# Patient Record
Sex: Male | Born: 1940 | Race: White | Hispanic: No | Smoking: Former smoker
Health system: Southern US, Community
[De-identification: ages and names within clinical notes are randomized; demographics above are authoritative.]

## PROBLEM LIST (undated history)

## (undated) DIAGNOSIS — K219 Gastro-esophageal reflux disease without esophagitis: Secondary | ICD-10-CM

## (undated) DIAGNOSIS — I10 Essential (primary) hypertension: Secondary | ICD-10-CM

## (undated) DIAGNOSIS — E7800 Pure hypercholesterolemia, unspecified: Secondary | ICD-10-CM

## (undated) DIAGNOSIS — K229 Disease of esophagus, unspecified: Secondary | ICD-10-CM

## (undated) DIAGNOSIS — I2510 Atherosclerotic heart disease of native coronary artery without angina pectoris: Secondary | ICD-10-CM

## (undated) DIAGNOSIS — I214 Non-ST elevation (NSTEMI) myocardial infarction: Secondary | ICD-10-CM

## (undated) HISTORY — PX: CORONARY ANGIOPLASTY: SHX604

## (undated) HISTORY — DX: Atherosclerotic heart disease of native coronary artery without angina pectoris: I25.10

## (undated) HISTORY — PX: CORONARY STENT PLACEMENT: SHX1402

## (undated) HISTORY — DX: Essential (primary) hypertension: I10

---

## 2001-04-06 ENCOUNTER — Ambulatory Visit (HOSPITAL_COMMUNITY): Admission: RE | Admit: 2001-04-06 | Discharge: 2001-04-06 | Payer: Self-pay | Admitting: *Deleted

## 2001-04-06 ENCOUNTER — Encounter: Payer: Self-pay | Admitting: Cardiology

## 2001-07-19 ENCOUNTER — Emergency Department (HOSPITAL_COMMUNITY): Admission: EM | Admit: 2001-07-19 | Discharge: 2001-07-19 | Payer: Self-pay | Admitting: Emergency Medicine

## 2004-11-25 DIAGNOSIS — I2510 Atherosclerotic heart disease of native coronary artery without angina pectoris: Secondary | ICD-10-CM

## 2004-11-25 HISTORY — DX: Atherosclerotic heart disease of native coronary artery without angina pectoris: I25.10

## 2005-05-05 ENCOUNTER — Observation Stay (HOSPITAL_COMMUNITY): Admission: EM | Admit: 2005-05-05 | Discharge: 2005-05-08 | Payer: Self-pay | Admitting: Emergency Medicine

## 2008-11-11 ENCOUNTER — Ambulatory Visit (HOSPITAL_BASED_OUTPATIENT_CLINIC_OR_DEPARTMENT_OTHER): Admission: RE | Admit: 2008-11-11 | Discharge: 2008-11-11 | Payer: Self-pay | Admitting: Orthopedic Surgery

## 2008-11-15 ENCOUNTER — Encounter: Payer: Self-pay | Admitting: Infectious Diseases

## 2008-11-29 ENCOUNTER — Ambulatory Visit: Payer: Self-pay | Admitting: Infectious Diseases

## 2008-11-29 DIAGNOSIS — I2510 Atherosclerotic heart disease of native coronary artery without angina pectoris: Secondary | ICD-10-CM | POA: Insufficient documentation

## 2008-11-29 HISTORY — DX: Atherosclerotic heart disease of native coronary artery without angina pectoris: I25.10

## 2008-11-30 ENCOUNTER — Encounter: Payer: Self-pay | Admitting: Infectious Diseases

## 2008-11-30 LAB — CONVERTED CEMR LAB
ALT: 44 units/L (ref 0–53)
AST: 30 units/L (ref 0–37)
Albumin: 4.3 g/dL (ref 3.5–5.2)
Alkaline Phosphatase: 42 units/L (ref 39–117)
BUN: 14 mg/dL (ref 6–23)
Basophils Absolute: 0 10*3/uL (ref 0.0–0.1)
Basophils Relative: 1 % (ref 0–1)
CO2: 23 meq/L (ref 19–32)
Calcium: 9.1 mg/dL (ref 8.4–10.5)
Chloride: 108 meq/L (ref 96–112)
Creatinine, Ser: 0.98 mg/dL (ref 0.40–1.50)
Eosinophils Absolute: 0.3 10*3/uL (ref 0.0–0.7)
Eosinophils Relative: 5 % (ref 0–5)
Glucose, Bld: 91 mg/dL (ref 70–99)
HCT: 43.6 % (ref 39.0–52.0)
Hemoglobin: 15.7 g/dL (ref 13.0–17.0)
Lymphocytes Relative: 33 % (ref 12–46)
Lymphs Abs: 2 10*3/uL (ref 0.7–4.0)
MCHC: 36 g/dL (ref 30.0–36.0)
MCV: 93.4 fL (ref 78.0–100.0)
Monocytes Absolute: 0.5 10*3/uL (ref 0.1–1.0)
Monocytes Relative: 9 % (ref 3–12)
Neutro Abs: 3.2 10*3/uL (ref 1.7–7.7)
Neutrophils Relative %: 52 % (ref 43–77)
Platelets: 196 10*3/uL (ref 150–400)
Potassium: 4.2 meq/L (ref 3.5–5.3)
RBC: 4.67 M/uL (ref 4.22–5.81)
RDW: 13.3 % (ref 11.5–15.5)
Sodium: 141 meq/L (ref 135–145)
Total Bilirubin: 0.4 mg/dL (ref 0.3–1.2)
Total Protein: 7.1 g/dL (ref 6.0–8.3)
WBC: 6.1 10*3/uL (ref 4.0–10.5)

## 2008-12-07 ENCOUNTER — Telehealth: Payer: Self-pay | Admitting: Infectious Diseases

## 2008-12-07 ENCOUNTER — Ambulatory Visit: Payer: Self-pay | Admitting: Infectious Diseases

## 2008-12-07 DIAGNOSIS — A319 Mycobacterial infection, unspecified: Secondary | ICD-10-CM | POA: Insufficient documentation

## 2008-12-14 ENCOUNTER — Encounter: Payer: Self-pay | Admitting: Infectious Diseases

## 2008-12-21 ENCOUNTER — Telehealth: Payer: Self-pay | Admitting: Infectious Diseases

## 2008-12-29 ENCOUNTER — Ambulatory Visit: Payer: Self-pay | Admitting: Infectious Diseases

## 2009-02-01 ENCOUNTER — Encounter: Payer: Self-pay | Admitting: Infectious Diseases

## 2011-01-01 ENCOUNTER — Ambulatory Visit
Admission: RE | Admit: 2011-01-01 | Discharge: 2011-01-01 | Disposition: A | Discharge status: Home / Self Care | Payer: MEDICARE | Source: Ambulatory Visit | Attending: Orthopedic Surgery | Admitting: Orthopedic Surgery

## 2011-01-01 ENCOUNTER — Other Ambulatory Visit: Payer: Self-pay | Admitting: Orthopedic Surgery

## 2011-01-01 DIAGNOSIS — M542 Cervicalgia: Secondary | ICD-10-CM

## 2011-01-03 ENCOUNTER — Other Ambulatory Visit: Payer: Self-pay | Admitting: Orthopedic Surgery

## 2011-01-03 DIAGNOSIS — M542 Cervicalgia: Secondary | ICD-10-CM

## 2011-01-08 ENCOUNTER — Other Ambulatory Visit: Payer: MEDICARE

## 2011-01-08 ENCOUNTER — Ambulatory Visit
Admission: RE | Admit: 2011-01-08 | Discharge: 2011-01-08 | Disposition: A | Discharge status: Home / Self Care | Payer: MEDICARE | Source: Ambulatory Visit | Attending: Orthopedic Surgery | Admitting: Orthopedic Surgery

## 2011-01-08 DIAGNOSIS — M542 Cervicalgia: Secondary | ICD-10-CM

## 2011-04-09 NOTE — Op Note (Signed)
Nicholas Reilly, Nicholas Reilly             ACCOUNT NO.:  [id number]   MEDICAL RECORD NO.:  [id number]          PATIENT TYPE:  AMB   LOCATION:  DSC                          FACILITY:  [facility]   PHYSICIAN:  Cindee Salt, M.D.       DATE OF BIRTH:  [birth year]   DATE OF PROCEDURE:  11/11/2008  DATE OF DISCHARGE:                               OPERATIVE REPORT   PREOPERATIVE DIAGNOSIS:  Infection right hand with nodularity along  lymphatics including epitrochlear node.   POSTOPERATIVE DIAGNOSIS:  Infection right hand with nodularity along  lymphatics including epitrochlear node.   OPERATION:  Incision and drainage of wound on hand, biopsy of  epitrochlear node with cultures.   SURGEON:  Cindee Salt, MD   ASSISTANT:  Carolyne Fiscal R.N.   ANESTHESIA:  General.   ANESTHESIOLOGIST:  Zenon Mayo, MD.   HISTORY:  The patient is a 70 year old male who suffered a puncture  wound to the inner metacarpal space of his right hand.  He has had  progressive swelling.  He has developed nodularity going up his arm with  the possibility of this being sporotrichosis.  He has not responded to  antibiotics for bacterial infection.  He is admitted now for incision  and drainage, cultures and biopsy of the epitrochlear node for  diagnosis.  He is aware of risks and complications including infection,  recurrence injury to arteries, nerves, tendons incomplete relief, and  the necessity of an Infectious Disease consultation especially if this  is fungal in nature.  The patient is seen in the preoperative area.  The  extremity marked by both the patient and the surgeon.   PROCEDURE:  The patient is brought to the operating room where general  anesthetic was carried out without difficulty under the direction of Dr.  Sampson Goon.  He was prepped using DuraPrep, supine position, right arm  free.  A time-out was taken to confirm the patient and procedure.  The  arm was elevated for exsanguination and tourniquet placed on  the upper  arm was inflated to 250 mmHg.  An incision was made over the area of the  inner metacarpal space over the metacarpal head of the index finger.  Purulent material was immediately encountered.  Cultures were taken for  both aerobic and anaerobic cultures.  Several large pearl type lesions  were noted.  These were excised along with a portion of the skin and  also sent to pathology for inspection.  A separate incision was then  made using a new knife in the more proximal nodule.  This was carried  down through the subcutaneous tissue.  Again, purulence was noted with  the similar pearl type findings.  These were also sent to pathology  along with cultures.  The epitrochlear node was then attended to.  Next,  a new blade again used and incision made.  This was carried down through  subcutaneous tissue.  The nodule was identified along the cephalic vein.  This was gently dissected free and opened.  Purulent material was again  encountered.  Cultures were taken at this level.  The node was  removed  and also sent to pathology.  On opening, it also had the pearly type  changes within the lymph node.  The wounds were then irrigated.  The  epitrochlear area was closed with interrupted 4-0 Vicryl Rapide sutures  in the skin only.  The more proximal wound was closed with interrupted 4-  0 Vicryl.  The initial area of the incision and injury was packed  with iodoform gauze.  Sterile compressive dressing was applied to each  of the wounds.  The patient tolerated the procedure well and was taken  to the recovery room for observation in satisfactory condition.  He will  be discharged home to return to the [facility] in 1 week  on Talwin NX and Septra DS.           ______________________________  Cindee Salt, M.D.     GK/MEDQ  D:  11/11/2008  T:  11/12/2008  Job:  [id number]   cc:   Dr. Arlyce Dice

## 2011-04-12 NOTE — Cardiovascular Report (Signed)
[name], Reilly             ACCOUNT NO.:  [id number]   MEDICAL RECORD NO.:  [id number]          PATIENT TYPE:  INP   LOCATION:  2025                         FACILITY:  [facility]   PHYSICIAN:  Nanetta Batty, M.D.   DATE OF BIRTH:  1941/05/23   DATE OF PROCEDURE:  05/07/2005  DATE OF DISCHARGE:                              CARDIAC CATHETERIZATION   CLINICAL HISTORY:  Nicholas Reilly is a 70 year old gentleman admitted May 05, 2005 with unstable angina.  His other risk factors include borderline  diabetes, hypertension, hyperlipidemia.  He has had two to four weeks of  accelerated chest pain and fatigue.  He was ruled out for myocardial  infarction and electrocardiographic changes.  Presents now for diagnostic  coronary arteriography.   PROCEDURE DESCRIPTION:  Patient was brought to the second floor [facility] in the postabsorptive state.  He was  premedicated with p.o. Valium.  His right groin was prepped and shaved in  the usual sterile fashion.  1% Xylocaine was used for local anesthesia.  A 6-  French sheath was inserted into the right femoral artery using standard  Seldinger technique.  6-French right and left Judkins diagnostic catheters  along with 6-French pigtail catheter were used for selective coronary  angiography, left ventriculography, subselective left internal mammary  artery angiography, and distal abdominal aortography.  Visipaque dye was  used for the entirety of the case.  Retrograde aortic, left ventricular, and  pullback pressures were recorded.   HEMODYNAMICS:  1.  Aortic systolic pressure 124, end-diastolic pressure 75.  2.  Left ventricular systolic pressure 129, end-diastolic pressure 15.   SELECTIVE CORONARY ANGIOGRAPHY:  Left main normal.   LAD:  The LAD had a 30% ostial stenosis.  There was a 40% segmental mid  stenosis after the third small diagonal branch.   Ramus intermedius:  __________ this is a high diagonal  branch, was moderate  in size and distally bifurcating.  It was free of significant disease.   Left circumflex:  This is a fairly large vessel, but nondominant.  There is  a large segment of moderate stenosis in the 70% range in the mid portion AV  groove circumflex.   Right coronary artery:  This is a moderate sized dominant vessel with a 70%  segmental mid stenosis.   LEFT VENTRICULOGRAM:  RAO left ventriculogram was performed using 25 mL of  Visipaque dye at 12 mL/second.  The overall LV EF was estimated at greater  than 60% without focal wall motion abnormalities.   Left internal mammary artery:  Subselectively visualized and was widely  patent.  It was suitable for use during coronary artery bypass grafting.   DISTAL ABDOMINAL AORTOGRAPHY:  Distal abdominal aortogram was performed  using 20 mL of Visipaque dye at 20 mL/second.  The renal arteries were  widely patent.  The infrarenal abdominal aorta and iliac bifurcation appear  free of significant atherosclerotic changes.   IMPRESSION:  Nicholas Reilly has moderate mid circumflex and right coronary  artery disease.  We will plan on performing IVUS guided PCI and stenting.   The existing 6-French in  the right femoral artery was exchanged over a wire  for a 7-French sheath.  A 6-French sheath was then inserted through a  femoral vein.  Patient received 5000 units of heparin total with an ACT of  258 at the end of the case.  He had received aspirin this morning and  received 600 mg p.o. Plavix.  Also received Integrilin double bolus  infusion.  Visipaque dye was used for the entirety of the intervention.  Retrograde aortic pressure was monitored during the case.  Patient received  200 mcg of intracoronary nitroglycerin down the circumflex coronary artery  after intervention.   Using a 7-French JL3.5 guiding catheter along with an 0.014 190 long Asahi  soft wire and a 2.5 x 15 Maverick PCI was performed after performance of   intravascular ultrasound.  The distal reference for the circumflex was 3.2 x  3.4 with a lesion diameter of 1.3 x 1.5.  The Maverick balloon was dilated  in an overlapping fashion at nominal pressures.  Following this a 3 x 28  CYPHER drug-eluting stent was then deployed at 14 atmospheres resulting in  reduction of 70% segmental mid circumflex stenosis to 0% residual.   Following this using a 7-French hockey stick with side holes guide catheter  along with an 0.014 190 Asahi wire and an IVUS catheter intravascular  ultrasound was performed revealing a distal reference of 3.1 x 3.2 and a  lesion diameter of 1.8 x 1.9.  This was then dilated with a 2.5 x 15  Maverick and stented with a 3 x 23 CYPHER at 14 atmospheres resulting in  reduction of a 70% segmental mid RCA stenosis to 0% residual.  Patient  tolerated the procedure well.  There were no hemodynamic or  electrocardiographic sequelae.  The guidewire and catheters were removed and  the sheaths were sewn securely in place.  Patient left the laboratory in  stable condition.   OVERALL IMPRESSION:  Successful IVUS guided PCI and stenting of the mid  circumflex and right coronary artery using CYPHER drug-eluting stents and  Integrilin __________ 2B3A inhibition.  The guidewire and catheters were  removed and the sheaths were sewn securely in place.  Patient left the  laboratory in stable condition.  Plans will be to hydrate him overnight.  The sheaths will be removed once the ACT falls below 150.  Treat with  aspirin and Plavix.  Discharge home in the morning.  Will follow up in  approximately two weeks.  Dr. Arlyce Dice was notified of these results.       JB/MEDQ  D:  05/07/2005  T:  05/07/2005  Job:  [id number]   cc:   Cath Lab   [facility]   Teena Irani. Arlyce Dice, M.D.  [address]  [place]  [place] [address]  Fax: [phone]

## 2011-04-12 NOTE — Discharge Summary (Signed)
Nicholas Reilly, REPSHER             ACCOUNT NO.:  [id number]   MEDICAL RECORD NO.:  [id number]          PATIENT TYPE:  INP   LOCATION:  6531                         FACILITY:  [facility]   PHYSICIAN:  Madaline Savage, M.D.DATE OF BIRTH:  11-Sep-1941   DATE OF ADMISSION:  05/05/2005  DATE OF DISCHARGE:  05/08/2005                                 DISCHARGE SUMMARY   DISCHARGE DIAGNOSES:  1.  Unstable angina with negative myocardial infarction.  2.  History of borderline hypertension.  3.  History of elevated glucose.  4.  History of depression and anxiety.  5.  Coronary artery disease, undergoing percutaneous transluminal coronary      angioplasty and stenting to the right coronary artery and the mid-      circumflex using CYPHER drug-eluting stent.  6.  Dyslipidemia.   DISCHARGE MEDICATIONS:  1.  Protonix 40 mg daily.  2.  Plavix 75 mg daily for at least 6 months.  3.  Lipitor 40 q.p.m.  4.  Lopressor 50 mg take half a tablet b.i.d.  5.  Enteric coated aspirin 81 mg daily.  6.  Prozac 40 mg daily.  7.  Nitroglycerin sublingual p.r.n. chest pain.   DISCHARGE INSTRUCTIONS:  1.  No strenuous activity.  2.  No lifting over 10 pounds for 3 days, then resuming regular activity.  3.  Low fat, low salt diet.  4.  Wash right groin cath site with soap and water; call if any bleeding,      swelling or drainage.  5.  Follow up with Dr. Allyson Sabal, May 22, 2005, at 3:15 p.m.   HISTORY OF PRESENT ILLNESS:  A 70 year old white married male patient came  to the emergency room on May 05, 2005 secondary to chest pain.  [facility] was asked to evaluate as patient had an  appointment to see Dr. Allyson Sabal on May 06, 2005.  Patient had been having  chest pain for weeks with shortness of breath, numbness in his left hand and  arm and one night of hot sweats.  One episode lasted about an hour and had  chest pain.  No nausea, no vomiting.  Over the last 3 days prior to this  admission,  it was progressively worse with episodes lasting longer and  longer with greater intensity, also decreased energy.  Came to the emergency  room and was seen and evaluated.   Past medical history includes borderline hypertension and elevated glucose,  unknown cholesterol.  No tobacco use.  He did smoke for 25 years and quit  around 20 years ago.  No surgery.  History of depression and anxiety.   OUTPATIENT MEDICATIONS:  1.  Prozac 40 mg daily.  2.  Enteric coated aspirin daily.   ALLERGIES:  BENADRYL causes nervousness and patient had a reaction to NUBAIN  during the hospitalization.   Family history, social history, review of systems -- see H&P.   PHYSICAL EXAMINATION AT DISCHARGE:  VITAL SIGNS:  Blood pressure 112/58,  pulse 64, respirations 20, temp 98.3.  Room air oxygen saturation 95%.  GENERAL:  On exam, alert, oriented, without  complaints.  CHEST:  No chest pain.  CARDIAC:  Heart sounds S1, S2, regular rate and rhythm.  LUNGS:  Clear.  EXTREMITIES:  Right groin is stable.   PROCEDURES:  May 07, 2005:  Combined left heart cath with stenosis of the  RCA at 70% and the mid-circ at 70%, underwent PTCA and stenting.  He also  has 30-40% LAD stenosis, normal renals, also normal LV function.   LABORATORY DATA:  Hemoglobin on admission 15.6, hematocrit 44, WBC 6.3,  platelets 200.  Neutrophils 56, lymphocytes 29, monocytes 8, eosinophils 6,  basophils 1, ___________ stable.  Pro time 1.9, INR 0.9, PTT 30.  Sodium  141, potassium 4.5, chloride 109, CO2 25, glucose 126, BUN 9, creatinine  1.1, calcium 8.8, total protein 6.1, albumin 3.8, AST 41, ALT 40, total  bilirubin 1.1, magnesium 2.2, and those remained stable.  Glycohemoglobin  was 5.3.  Cardiac enzymes: CK 274, MB 5.5, CK 225, MB 4.4, CK is 191 and  114, MB is 3.9 and 2.2.  Troponin I less than 0.01.  Total cholesterol 187,  triglycerides 376, HDL 29, and LDL 83.  UA was clear.   Chest x-ray revealed no evidence for acute  abnormality.  EKG: Sinus rhythm,  normal EKG.   HOSPITAL COURSE:  Mr. Kirschenmann was admitted by Dr. Elsie Lincoln on call for Dr.  Allyson Sabal secondary to increasing chest pain.  He was admitted and placed on IV  heparin and p.r.n. nitroglycerin and plans for cardiac cath were made.  He  underwent cardiac cath on May 07, 2005.  Enzymes had all been negative and  procedure as previously stated.  By May 08, 2005 he was stable without  complaints ready for discharge home.  Note, had followup with Dr. Allyson Sabal.      Darcella Gasman. Annie Paras, N.P.    ______________________________  Madaline Savage, M.D.    LRI/MEDQ  D:  07/18/2005  T:  07/19/2005  Job:  [id number]   cc:   Madaline Savage, M.D.  [address]  [place]  [place] [address]  Fax: [phone]   Nanetta Batty, M.D.  Fax: [phone]   Teena Irani. Arlyce Dice, M.D.  [address]  [place]  [place] [address]  Fax: [phone]

## 2011-08-30 LAB — TISSUE CULTURE: Culture: NO GROWTH

## 2011-08-30 LAB — AFB CULTURE WITH SMEAR (NOT AT ARMC)
Acid Fast Smear: NONE SEEN
Acid Fast Smear: NONE SEEN

## 2011-08-30 LAB — WOUND CULTURE
Culture: NO GROWTH
Culture: NO GROWTH

## 2011-08-30 LAB — FUNGUS CULTURE W SMEAR
Fungal Smear: NONE SEEN
Fungal Smear: NONE SEEN

## 2011-08-30 LAB — ANAEROBIC CULTURE

## 2011-08-30 LAB — POCT HEMOGLOBIN-HEMACUE: Hemoglobin: 15.5 g/dL (ref 13.0–17.0)

## 2013-05-09 ENCOUNTER — Emergency Department (HOSPITAL_COMMUNITY)
Admission: EM | Admit: 2013-05-09 | Discharge: 2013-05-09 | Disposition: A | Discharge status: Home / Self Care | Payer: Medicare Other | Attending: Emergency Medicine | Admitting: Emergency Medicine

## 2013-05-09 ENCOUNTER — Encounter (HOSPITAL_COMMUNITY): Payer: Self-pay | Admitting: Emergency Medicine

## 2013-05-09 ENCOUNTER — Encounter (HOSPITAL_COMMUNITY)
Admission: EM | Disposition: A | Discharge status: Home / Self Care | Payer: Self-pay | Source: Home / Self Care | Attending: Emergency Medicine

## 2013-05-09 ENCOUNTER — Emergency Department (HOSPITAL_COMMUNITY): Payer: Medicare Other

## 2013-05-09 DIAGNOSIS — T18128A Food in esophagus causing other injury, initial encounter: Secondary | ICD-10-CM

## 2013-05-09 DIAGNOSIS — Z800 Family history of malignant neoplasm of digestive organs: Secondary | ICD-10-CM | POA: Insufficient documentation

## 2013-05-09 DIAGNOSIS — R1310 Dysphagia, unspecified: Secondary | ICD-10-CM | POA: Insufficient documentation

## 2013-05-09 DIAGNOSIS — K219 Gastro-esophageal reflux disease without esophagitis: Secondary | ICD-10-CM | POA: Insufficient documentation

## 2013-05-09 DIAGNOSIS — K222 Esophageal obstruction: Secondary | ICD-10-CM | POA: Insufficient documentation

## 2013-05-09 DIAGNOSIS — E7800 Pure hypercholesterolemia, unspecified: Secondary | ICD-10-CM | POA: Insufficient documentation

## 2013-05-09 DIAGNOSIS — K449 Diaphragmatic hernia without obstruction or gangrene: Secondary | ICD-10-CM | POA: Insufficient documentation

## 2013-05-09 HISTORY — DX: Gastro-esophageal reflux disease without esophagitis: K21.9

## 2013-05-09 HISTORY — DX: Atherosclerotic heart disease of native coronary artery without angina pectoris: I25.10

## 2013-05-09 HISTORY — DX: Pure hypercholesterolemia, unspecified: E78.00

## 2013-05-09 HISTORY — DX: Disease of esophagus, unspecified: K22.9

## 2013-05-09 HISTORY — PX: ESOPHAGOGASTRODUODENOSCOPY (EGD) WITH ESOPHAGEAL DILATION: SHX5812

## 2013-05-09 SURGERY — ESOPHAGOGASTRODUODENOSCOPY (EGD) WITH ESOPHAGEAL DILATION
Anesthesia: Moderate Sedation

## 2013-05-09 SURGERY — EGD (ESOPHAGOGASTRODUODENOSCOPY)
Anesthesia: Moderate Sedation

## 2013-05-09 MED ORDER — SODIUM CHLORIDE 0.9 % IV SOLN: INTRAVENOUS | Status: DC

## 2013-05-09 MED ORDER — ONDANSETRON HCL 4 MG/2ML IJ SOLN
4.0000 mg | Freq: Once | INTRAMUSCULAR | Status: AC
  Administered 2013-05-09: 4 mg via INTRAVENOUS
  Filled 2013-05-09: qty 2

## 2013-05-09 MED ORDER — FENTANYL CITRATE 0.05 MG/ML IJ SOLN
INTRAMUSCULAR | Status: DC | PRN
  Administered 2013-05-09 (×2): 25 ug via INTRAVENOUS

## 2013-05-09 MED ORDER — GLUCAGON HCL (RDNA) 1 MG IJ SOLR
1.0000 mg | Freq: Once | INTRAMUSCULAR | Status: AC
  Administered 2013-05-09: 1 mg via INTRAVENOUS
  Filled 2013-05-09: qty 1

## 2013-05-09 MED ORDER — MIDAZOLAM HCL 10 MG/2ML IJ SOLN
INTRAMUSCULAR | Status: DC | PRN
  Administered 2013-05-09: 1 mg via INTRAVENOUS
  Administered 2013-05-09 (×2): 2 mg via INTRAVENOUS

## 2013-05-09 MED ORDER — SODIUM CHLORIDE 0.9 % IV BOLUS (SEPSIS)
500.0000 mL | Freq: Once | INTRAVENOUS | Status: AC
  Administered 2013-05-09: 18:00:00 via INTRAVENOUS

## 2013-05-09 MED ORDER — BUTAMBEN-TETRACAINE-BENZOCAINE 2-2-14 % EX AERO
INHALATION_SPRAY | CUTANEOUS | Status: DC | PRN
  Administered 2013-05-09: 2 via TOPICAL

## 2013-05-09 NOTE — ED Notes (Addendum)
Pt coming from [facility] [id number]). Pt hx of cardiac stents. Reports since last night he vomits every time he eats or drinks. Feels like something is stuck in his throat, feels this in the middle of his chest. [facility] not [facility].   PA Havier saw pt

## 2013-05-09 NOTE — Consult Note (Signed)
Reason for Consult: Dysphagia and possible food impaction. Referring Physician: ER  Nicholas Reilly HPI: This is a 72 year old male with a PMH of dysphagia and a family history of colon cancer who presented to the ER with a food impaction.  He was eating steak last evening at 8 PM and acutely he was not able to tolerate any of his oral secretions.  In the past he reports numerous episodes of dysphagia that resolves within 45 minutes with the use of benadryl and Nexium, but his symptoms continued to persist.  It worsened to the point that he was not able to handle his oral secretions.  Three years ago he underwent an EGD/Colonoscopy with Dr. Loreta Ave, but he cannot recall if any dilation was performed.  The colonoscopy was negative for any abnormalities.  The patient has a history of CAD s/p stent placement 9 years ago and he was taking Plavix, however, he denies taking the medication for the past week.  He states that he had some type of virus and he held the medication.  Past Medical History  Diagnosis Date  . Esophageal abnormality   . GERD (gastroesophageal reflux disease)   . Hypercholesteremia     Past Surgical History  Procedure Laterality Date  . Coronary stent placement      History reviewed. No pertinent family history.  Social History:  reports that he has never smoked. He does not have any smokeless tobacco history on file. He reports that he does not drink alcohol or use illicit drugs.  Allergies:  Allergies  Allergen Reactions  . Doxycycline     REACTION: nausea, vommiting    Medications:  Scheduled:  Continuous: . sodium chloride      No results found for this or any previous visit (from the past 24 hour(s)).   Dg Abd Acute W/chest  05/09/2013   *RADIOLOGY REPORT*  Clinical Data: Swallowed foreign body  ACUTE ABDOMEN SERIES (ABDOMEN 2 VIEW & CHEST 1 VIEW)  Comparison: Chest radiograph 05/05/2005  Findings: Heart, mediastinal, hilar contours are normal.  Cardiac leads  project over the chest.  The lungs are clear.  Negative for pleural effusion or pneumothorax.  No unexpected radiopaque foreign body is identified in the chest.  No free intraperitoneal air.  Bowel gas pattern is nonobstructive. No unexpected radiopaque foreign body projects over the abdomen or pelvis. Probable atherosclerotic calcifications in the expected location of the splenic artery.  Phleboliths are seen in the pelvis.  No definite urinary tract stone.  Typical degenerative changes of the spine.  IMPRESSION:  1.  No unexpected radiopaque foreign body is identified in the chest abdomen or pelvis. 2.  Nonobstructive bowel gas pattern. 3.  No acute cardiopulmonary disease.   Original Report Authenticated By: Britta Mccreedy, M.D.    ROS:  As stated above in the HPI otherwise negative.  Blood pressure 134/61, pulse 80, temperature 98.6 F (37 C), temperature source Oral, resp. rate 18, height 6' (1.829 m), weight 226 lb (102.513 kg), SpO2 96.00%.    PE: Gen: NAD, Alert and Oriented HEENT:  [place]/AT, EOMI Neck: Supple, no LAD Lungs: CTA Bilaterally CV: RRR without M/G/R ABM: Soft, NTND, +BS Ext: No C/C/E  Assessment/Plan: 1) Possible food impaction.  Plan: 1) Emergent EGD.  Giang Hemme D 05/09/2013, 9:15 PM

## 2013-05-09 NOTE — ED Notes (Signed)
Patient is not in any apparent distress. States that he feels something is stuck in his throat.

## 2013-05-09 NOTE — Op Note (Signed)
[facility] [address] [place], [address]   OPERATIVE PROCEDURE REPORT  PATIENT: Nicholas Reilly, Nicholas Reilly  MR#: [id number] BIRTHDATE: 1941-09-12  GENDER: Male ENDOSCOPIST: Jeani Hawking, MD ASSISTANT:   Beryle Beams, technician and Anthony Sar, RN PROCEDURE DATE: 05/09/2013 PROCEDURE:   EGD with dilation and biopsies ASA CLASS:   Class III INDICATIONS:Dysphagia. MEDICATIONS: Fentanyl 50 mcg IV and Versed 5 mg IV TOPICAL ANESTHETIC:   Cetacaine Spray DESCRIPTION OF PROCEDURE:   After the risks benefits and alternatives of the procedure were thoroughly explained, informed consent was obtained.  The PENTAX GASTOROSCOPE [id number]  endoscope was introduced through the mouth  and advanced to the second portion of the duodenum Without limitations.      The instrument was slowly withdrawn as the mucosa was fully examined.   FINDINGS: Upon initial entry into the esophagus there was evidence of concentric rings suggesting EoE.  In the distal esophagus there was a meat bolus that was able to be pushed into the gastric lumen. A benign peptic appearing stricture was noted in the setting of a 5 cm hiatal hernia.  No other abnormalities were noted in the gastric lumen and the duodenum.  Over a guidewire a 12.8 mm Savary dilator was inserted without any resistance.  Subsequently the 14, 15, and 16 mm dilators were used and after the 16 mm dilation blood was noted on the dilator.  No evidence of crepitus after the dilation.  A relook endoscopy was performed and the expected mucosal tear was identified.  Biopsies of the mid and proximal esophagus were performed to evaluate for EoE.          The scope was then withdrawn from the patient and the procedure terminated.  COMPLICATIONS: There were no complications. IMPRESSION: 1) Benign distal esophageal stricture s/p dilatino. 2) 5 cm hiatal hernia. 3) ? Eosinophilic esophagitis.  RECOMMENDATIONS: 1) Chew food well. 2) Continue  with Nexium. 3) Restart Plavix in 1-2 days. 4) Await biopsy results and follow up with Dr. Loreta Ave in 2-4 weeks.  _______________________________ Rosalie DoctorJeani Hawking, MD 05/09/2013 10:06 PM

## 2013-05-09 NOTE — ED Provider Notes (Signed)
History     CSN: [id number]  Arrival date & time 05/09/13  1728   First MD Initiated Contact with Patient 05/09/13 1806      Chief Complaint  Patient presents with  . Swallowed Foreign Body    (Consider location/radiation/quality/duration/timing/severity/associated sxs/prior treatment) Patient is a 72 y.o. male presenting with foreign body swallowed. The history is provided by the patient.  Swallowed Foreign Body This is a recurrent problem. Associated symptoms include chest pain and abdominal pain. Pertinent negatives include no headaches and no shortness of breath.   patient states he has had repeat issues of food getting stuck in his esophagus. He states is due to food allergies and normally he takes Benadryl and Zyrtec and will go away. He states around 8:00 last night she was eating and felt something is stuck at the bottom of his chest. He states since then has had vomiting and has had stomach contents coming up. He states he has been having to spit into a cup. He states he has a little tightness in his chest. No fevers. No cough. No lightheadedness. Patient states he has had decreased gas also. She does not get any larger.Patient states that he has had an upper GI and colonoscopy previously. He states was done off of [facility].  Past Medical History  Diagnosis Date  . Esophageal abnormality   . GERD (gastroesophageal reflux disease)   . Hypercholesteremia   . Coronary artery disease     Past Surgical History  Procedure Laterality Date  . Coronary stent placement    . Coronary angioplasty      History reviewed. No pertinent family history.  History  Substance Use Topics  . Smoking status: Never Smoker   . Smokeless tobacco: Not on file  . Alcohol Use: No      Review of Systems  Constitutional: Negative for activity change and appetite change.  HENT: Negative for neck stiffness.   Eyes: Negative for pain.  Respiratory: Negative for chest tightness and  shortness of breath.   Cardiovascular: Positive for chest pain. Negative for leg swelling.  Gastrointestinal: Positive for vomiting and abdominal pain. Negative for nausea and diarrhea.  Genitourinary: Negative for flank pain.  Musculoskeletal: Negative for back pain.  Skin: Negative for rash.  Neurological: Negative for weakness, numbness and headaches.  Psychiatric/Behavioral: Negative for behavioral problems.    Allergies  Doxycycline  Home Medications   Current Outpatient Rx  Name  Route  Sig  Dispense  Refill  . clopidogrel (PLAVIX) 75 MG tablet   Oral   Take 75 mg by mouth daily.         . diphenhydrAMINE (SOMINEX) 25 MG tablet   Oral   Take 25 mg by mouth at bedtime as needed for sleep.         Marland Kitchen esomeprazole (NEXIUM) 40 MG capsule   Oral   Take 40 mg by mouth daily before breakfast.         . simvastatin (ZOCOR) 20 MG tablet   Oral   Take 20 mg by mouth every evening.           BP 153/88  Pulse 84  Temp(Src) 98.2 F (36.8 C) (Oral)  Resp 15  Ht 6' (1.829 m)  Wt 226 lb (102.513 kg)  BMI 30.64 kg/m2  SpO2 93%  Physical Exam  Constitutional: He is oriented to person, place, and time. He appears well-developed and well-nourished.  HENT:  Head: Normocephalic and atraumatic.  Eyes: Pupils are equal,  round, and reactive to light.  Neck: Normal range of motion.  Cardiovascular: Normal rate.   Pulmonary/Chest: Effort normal. He has no wheezes.  Abdominal: There is no tenderness.  patietn is obese. Patient has cup she has been spitting into, but is not actively vomiting.  Musculoskeletal: Normal range of motion.  Neurological: He is alert and oriented to person, place, and time.    ED Course  Procedures (including critical care time)  Labs Reviewed - No data to display Dg Abd Acute W/chest  05/09/2013   *RADIOLOGY REPORT*  Clinical Data: Swallowed foreign body  ACUTE ABDOMEN SERIES (ABDOMEN 2 VIEW & CHEST 1 VIEW)  Comparison: Chest radiograph  05/05/2005  Findings: Heart, mediastinal, hilar contours are normal.  Cardiac leads project over the chest.  The lungs are clear.  Negative for pleural effusion or pneumothorax.  No unexpected radiopaque foreign body is identified in the chest.  No free intraperitoneal air.  Bowel gas pattern is nonobstructive. No unexpected radiopaque foreign body projects over the abdomen or pelvis. Probable atherosclerotic calcifications in the expected location of the splenic artery.  Phleboliths are seen in the pelvis.  No definite urinary tract stone.  Typical degenerative changes of the spine.  IMPRESSION:  1.  No unexpected radiopaque foreign body is identified in the chest abdomen or pelvis. 2.  Nonobstructive bowel gas pattern. 3.  No acute cardiopulmonary disease.   Original Report Authenticated By: Britta Mccreedy, M.D.     1. Food impaction of esophagus, initial encounter       MDM  Esophageal foreign body. Likely meat impaction. No relief with glucagon. Taken to endo by GI        Juliet Rude. Rubin Payor, MD 05/09/13 2358

## 2013-05-09 NOTE — ED Notes (Addendum)
Patient states that he has had trouble swallowing since last PM/ patient does report some burning in his epigastric region

## 2013-05-10 MED FILL — Diphenhydramine HCl Inj 50 MG/ML: INTRAMUSCULAR | Qty: 1 | Status: AC

## 2013-05-12 ENCOUNTER — Encounter (HOSPITAL_COMMUNITY): Payer: Self-pay | Admitting: Gastroenterology

## 2013-07-07 ENCOUNTER — Telehealth: Payer: Self-pay | Admitting: Cardiovascular Disease

## 2013-07-07 NOTE — Telephone Encounter (Signed)
Pt states he called pharmacist and they had faxed his prescription to [place] about 3 times-I told him to tell them to call us. He said they refused to call-he need his Simvastatin 80 mg # 30.Call to [facility]-[phone]

## 2013-07-08 MED ORDER — SIMVASTATIN 80 MG PO TABS: 80.0000 mg | ORAL_TABLET | Freq: Every evening | ORAL | Status: DC

## 2013-07-08 NOTE — Telephone Encounter (Signed)
Refills sent to pharmacy. 

## 2013-10-20 ENCOUNTER — Telehealth: Payer: Self-pay | Admitting: Cardiovascular Disease

## 2013-10-20 DIAGNOSIS — Z79899 Other long term (current) drug therapy: Secondary | ICD-10-CM

## 2013-10-20 DIAGNOSIS — E785 Hyperlipidemia, unspecified: Secondary | ICD-10-CM

## 2013-10-20 NOTE — Telephone Encounter (Signed)
Order placed for routine lipid and liver. Lm for patient

## 2013-10-20 NOTE — Telephone Encounter (Signed)
Pt is asking that a lab order be sent to [facility] for his yearly labs before he sees Dr.Berry ...   Thanks

## 2013-10-20 NOTE — Telephone Encounter (Signed)
His appt is on 11/11/13 @ 3:45pm

## 2013-10-28 LAB — LIPID PANEL
Cholesterol: 143 mg/dL (ref 0–200)
HDL: 31 mg/dL — ABNORMAL LOW (ref >39)
Triglycerides: 111 mg/dL (ref <150)

## 2013-10-28 LAB — HEPATIC FUNCTION PANEL
ALT: 40 U/L (ref 0–53)
AST: 31 U/L (ref 0–37)
Albumin: 4 g/dL (ref 3.5–5.2)
Alkaline Phosphatase: 38 U/L — ABNORMAL LOW (ref 39–117)
Total Protein: 6.7 g/dL (ref 6.0–8.3)

## 2013-11-02 ENCOUNTER — Encounter: Payer: Self-pay | Admitting: *Deleted

## 2013-11-11 ENCOUNTER — Ambulatory Visit: Payer: Medicare Other | Admitting: Cardiovascular Disease

## 2013-11-12 ENCOUNTER — Encounter: Payer: Self-pay | Admitting: Cardiovascular Disease

## 2013-11-12 ENCOUNTER — Ambulatory Visit (INDEPENDENT_AMBULATORY_CARE_PROVIDER_SITE_OTHER): Payer: PRIVATE HEALTH INSURANCE | Admitting: Cardiovascular Disease

## 2013-11-12 VITALS — BP 150/80 | HR 71 | Ht 72.0 in | Wt 242.1 lb

## 2013-11-12 DIAGNOSIS — I10 Essential (primary) hypertension: Secondary | ICD-10-CM | POA: Insufficient documentation

## 2013-11-12 DIAGNOSIS — I2510 Atherosclerotic heart disease of native coronary artery without angina pectoris: Secondary | ICD-10-CM

## 2013-11-12 DIAGNOSIS — G4733 Obstructive sleep apnea (adult) (pediatric): Secondary | ICD-10-CM

## 2013-11-12 DIAGNOSIS — E785 Hyperlipidemia, unspecified: Secondary | ICD-10-CM

## 2013-11-12 MED ORDER — NITROGLYCERIN 0.4 MG/SPRAY TL SOLN: 1.0000 | Status: DC | PRN

## 2013-11-12 NOTE — Assessment & Plan Note (Signed)
On statin therapy with recent lipid profile performed 10/28/13 revealing a total cholesterol of 143, LDL of 90 and an HDL of 31

## 2013-11-12 NOTE — Progress Notes (Signed)
11/12/2013 Nicholas Reilly   Jun 07, 1941  [id number]  Primary Physician No PCP Per Patient Primary Cardiologist: Runell Gess MD Roseanne Reno   HPI:  The patient is a 72 year old mild to moderately overweight married Caucasian male father of 4, grandfather to 5 grandchildren who I last saw a year ago. He has a history of RCA and circumflex stenting by myself May 07, 2005 with Cypher drug-eluting stents. He was re-cathed a year later after a false-positive Myoview and found to have patent stents with normal LV function. His other problems include obstructive sleep apnea, though he does not wear CPAP, untreated hypertension and hyperlipidemia. He denies chest pain or shortness of breath.his most recent lipid profile performed 10/28/13 revealed a glucose of 143, LDL 90 and HDL of 31.   Current Outpatient Prescriptions  Medication Sig Dispense Refill  . clopidogrel (PLAVIX) 75 MG tablet Take 75 mg by mouth daily.      Marland Kitchen esomeprazole (NEXIUM) 40 MG capsule Take 40 mg by mouth daily before breakfast.      . simvastatin (ZOCOR) 80 MG tablet Take 1 tablet (80 mg total) by mouth every evening.  30 tablet  4   No current facility-administered medications for this visit.    Allergies  Allergen Reactions  . Doxycycline     REACTION: nausea, vommiting    History   Social History  . Marital Status: Married    Spouse Name: N/A    Number of Children: N/A  . Years of Education: N/A   Occupational History  . Not on file.   Social History Main Topics  . Smoking status: Former Smoker    Types: Cigarettes    Quit date: 11/13/1983  . Smokeless tobacco: Not on file  . Alcohol Use: 2.0 oz/week    4 drink(s) per week  . Drug Use: No  . Sexual Activity: Not on file   Other Topics Concern  . Not on file   Social History Narrative  . No narrative on file     Review of Systems: General: negative for chills, fever, night sweats or weight changes.  Cardiovascular:  negative for chest pain, dyspnea on exertion, edema, orthopnea, palpitations, paroxysmal nocturnal dyspnea or shortness of breath Dermatological: negative for rash Respiratory: negative for cough or wheezing Urologic: negative for hematuria Abdominal: negative for nausea, vomiting, diarrhea, bright red blood per rectum, melena, or hematemesis Neurologic: negative for visual changes, syncope, or dizziness All other systems reviewed and are otherwise negative except as noted above.    Blood pressure 150/80, pulse 71, height 6' (1.829 m), weight 242 lb 1.6 oz (109.816 kg).  General appearance: alert and no distress Neck: no adenopathy, no carotid bruit, no JVD, supple, symmetrical, trachea midline and thyroid not enlarged, symmetric, no tenderness/mass/nodules Lungs: clear to auscultation bilaterally Heart: regular rate and rhythm, S1, S2 normal, no murmur, click, rub or gallop Extremities: extremities normal, atraumatic, no cyanosis or edema  EKG sizes were 71 without ST or T wave changes  ASSESSMENT AND PLAN:   CORONARY ARTERY DISEASE Status post stenting of the RCA and circumflex coronary arteries by myself 05/07/05. He was recathed a year later after a false positive Myoview revealing patent stents with normal LV function. He denies chest pain or shortness of breath.his last Myoview performed 02/21/10 was nonischemic  Obstructive sleep apnea He does not wear CPAP  Essential hypertension Controlled on current medications  Hyperlipidemia On statin therapy with recent lipid profile performed 10/28/13 revealing a total  cholesterol of 143, LDL of 90 and an HDL of 31      Runell Gess MD [facility], [facility] 11/12/2013 11:14 AM

## 2013-11-12 NOTE — Assessment & Plan Note (Signed)
He does not wear CPAP

## 2013-11-12 NOTE — Patient Instructions (Signed)
Your physician wants you to follow-up in: 1 year with Dr Berry. You will receive a reminder letter in the mail two months in advance. If you don't receive a letter, please call our office to schedule the follow-up appointment.  

## 2013-11-12 NOTE — Assessment & Plan Note (Addendum)
Status post stenting of the RCA and circumflex coronary arteries by myself 05/07/05. He was recathed a year later after a false positive Myoview revealing patent stents with normal LV function. He denies chest pain or shortness of breath.his last Myoview performed 02/21/10 was nonischemic

## 2013-11-12 NOTE — Assessment & Plan Note (Signed)
Controlled on current medications 

## 2013-12-20 ENCOUNTER — Telehealth: Payer: Self-pay | Admitting: Cardiovascular Disease

## 2013-12-20 ENCOUNTER — Other Ambulatory Visit: Payer: Self-pay | Admitting: Cardiovascular Disease

## 2013-12-20 NOTE — Telephone Encounter (Signed)
Rx was sent to pharmacy electronically. 

## 2013-12-23 ENCOUNTER — Telehealth: Payer: Self-pay | Admitting: *Deleted

## 2013-12-23 NOTE — Telephone Encounter (Signed)
Returned CPAP supply order. 

## 2014-01-26 ENCOUNTER — Telehealth: Payer: Self-pay | Admitting: Cardiovascular Disease

## 2014-01-26 NOTE — Telephone Encounter (Signed)
Please call having problem with his insurance, they are saying that he does nott have heart failure or cardio vascular disorder.

## 2014-01-26 NOTE — Telephone Encounter (Signed)
Needs an insurance form filled out and faxed in order to keep his current insurance policy.  Will fax to the attention of Kathyrn.  Chart placed in her office.

## 2014-01-26 NOTE — Telephone Encounter (Signed)
Now living in [place].  Has new insurance and they need a form filled out stating his diagnosis.  He will fax it to the attention of Samara DeistKathryn as soon as he gets it.

## 2014-02-08 ENCOUNTER — Telehealth: Payer: Self-pay | Admitting: *Deleted

## 2014-02-08 NOTE — Telephone Encounter (Signed)
Hilda LiasMarie called in regards to Mr. Bloxom's diagnosis. She needs some information.  JB

## 2014-02-08 NOTE — Telephone Encounter (Signed)
Message forwarded to Kathryn, RN.  

## 2014-02-10 ENCOUNTER — Telehealth: Payer: Self-pay | Admitting: *Deleted

## 2014-02-10 NOTE — Telephone Encounter (Signed)
Spoke with [name], he asked if Mr Nicholas Reilly had any CV disease.  Yes, he has CAD that is stable.

## 2014-02-10 NOTE — Telephone Encounter (Signed)
Nicholas HesselbachMaria was calling in regards to his Dx.  [name]

## 2014-02-10 NOTE — Telephone Encounter (Signed)
Returned call.  Left message to call back tomorrow between 8am and 4pm.  Also to leave details r/t questions.

## 2014-02-16 ENCOUNTER — Telehealth: Payer: Self-pay | Admitting: Cardiovascular Disease

## 2014-02-16 NOTE — Telephone Encounter (Signed)
Reference#[id number]-Wants to know if he still have Cardio Vascular disorders? Please notify them before 02-22-14,so he can still be enrolled in this program.

## 2014-02-16 NOTE — Telephone Encounter (Signed)
Pt has current diagnoses: CAD, HTN, Hyperlipidemia.  Returned call.  Left message to call back before 4pm.  Message forwarded to Samara DeistKathryn, [place].

## 2014-03-17 NOTE — Telephone Encounter (Signed)
Closed encounter °

## 2014-03-18 ENCOUNTER — Telehealth: Payer: Self-pay | Admitting: *Deleted

## 2014-03-18 NOTE — Telephone Encounter (Signed)
Fax received from [employer] [facility] asking if the patient has certain conditions, including CAD.  Included in the packet was a signed release from the patient for us to release info to [employer] [facility].  I responded to the fax by faxing a piece of paper on our letter head saying that the patient has CAD.

## 2016-01-20 ENCOUNTER — Inpatient Hospital Stay (HOSPITAL_COMMUNITY)
Admission: EM | Admit: 2016-01-20 | Discharge: 2016-01-22 | DRG: 247 | Disposition: A | Discharge status: Home / Self Care | Payer: Medicare Other | Attending: Interventional Cardiology | Admitting: Interventional Cardiology

## 2016-01-20 ENCOUNTER — Other Ambulatory Visit: Payer: Self-pay

## 2016-01-20 ENCOUNTER — Encounter (HOSPITAL_COMMUNITY)
Admission: EM | Disposition: A | Discharge status: Home / Self Care | Payer: Self-pay | Source: Home / Self Care | Attending: Interventional Cardiology

## 2016-01-20 ENCOUNTER — Encounter (HOSPITAL_COMMUNITY): Payer: Self-pay

## 2016-01-20 ENCOUNTER — Emergency Department (HOSPITAL_COMMUNITY): Payer: Medicare Other

## 2016-01-20 DIAGNOSIS — I2111 ST elevation (STEMI) myocardial infarction involving right coronary artery: Secondary | ICD-10-CM

## 2016-01-20 DIAGNOSIS — I2582 Chronic total occlusion of coronary artery: Secondary | ICD-10-CM | POA: Diagnosis present

## 2016-01-20 DIAGNOSIS — Z955 Presence of coronary angioplasty implant and graft: Secondary | ICD-10-CM

## 2016-01-20 DIAGNOSIS — Z79899 Other long term (current) drug therapy: Secondary | ICD-10-CM

## 2016-01-20 DIAGNOSIS — Z8249 Family history of ischemic heart disease and other diseases of the circulatory system: Secondary | ICD-10-CM | POA: Diagnosis not present

## 2016-01-20 DIAGNOSIS — R1319 Other dysphagia: Secondary | ICD-10-CM | POA: Diagnosis present

## 2016-01-20 DIAGNOSIS — I25119 Atherosclerotic heart disease of native coronary artery with unspecified angina pectoris: Secondary | ICD-10-CM | POA: Diagnosis present

## 2016-01-20 DIAGNOSIS — Z881 Allergy status to other antibiotic agents status: Secondary | ICD-10-CM

## 2016-01-20 DIAGNOSIS — I10 Essential (primary) hypertension: Secondary | ICD-10-CM | POA: Diagnosis present

## 2016-01-20 DIAGNOSIS — E785 Hyperlipidemia, unspecified: Secondary | ICD-10-CM | POA: Diagnosis present

## 2016-01-20 DIAGNOSIS — Y848 Other medical procedures as the cause of abnormal reaction of the patient, or of later complication, without mention of misadventure at the time of the procedure: Secondary | ICD-10-CM | POA: Diagnosis not present

## 2016-01-20 DIAGNOSIS — T82855A Stenosis of coronary artery stent, initial encounter: Secondary | ICD-10-CM | POA: Diagnosis present

## 2016-01-20 DIAGNOSIS — Z87891 Personal history of nicotine dependence: Secondary | ICD-10-CM

## 2016-01-20 DIAGNOSIS — I252 Old myocardial infarction: Secondary | ICD-10-CM | POA: Diagnosis not present

## 2016-01-20 DIAGNOSIS — I213 ST elevation (STEMI) myocardial infarction of unspecified site: Secondary | ICD-10-CM

## 2016-01-20 DIAGNOSIS — L409 Psoriasis, unspecified: Secondary | ICD-10-CM | POA: Diagnosis not present

## 2016-01-20 DIAGNOSIS — Z23 Encounter for immunization: Secondary | ICD-10-CM

## 2016-01-20 DIAGNOSIS — R1310 Dysphagia, unspecified: Secondary | ICD-10-CM

## 2016-01-20 DIAGNOSIS — Z7902 Long term (current) use of antithrombotics/antiplatelets: Secondary | ICD-10-CM

## 2016-01-20 DIAGNOSIS — K219 Gastro-esophageal reflux disease without esophagitis: Secondary | ICD-10-CM | POA: Diagnosis not present

## 2016-01-20 DIAGNOSIS — I2510 Atherosclerotic heart disease of native coronary artery without angina pectoris: Secondary | ICD-10-CM

## 2016-01-20 DIAGNOSIS — Z800 Family history of malignant neoplasm of digestive organs: Secondary | ICD-10-CM | POA: Diagnosis not present

## 2016-01-20 DIAGNOSIS — I214 Non-ST elevation (NSTEMI) myocardial infarction: Secondary | ICD-10-CM

## 2016-01-20 DIAGNOSIS — R079 Chest pain, unspecified: Secondary | ICD-10-CM | POA: Diagnosis present

## 2016-01-20 DIAGNOSIS — I2119 ST elevation (STEMI) myocardial infarction involving other coronary artery of inferior wall: Secondary | ICD-10-CM | POA: Diagnosis present

## 2016-01-20 HISTORY — PX: CARDIAC CATHETERIZATION: SHX172

## 2016-01-20 HISTORY — DX: Non-ST elevation (NSTEMI) myocardial infarction: I21.4

## 2016-01-20 LAB — CBC
HCT: 44.4 % (ref 39.0–52.0)
HEMATOCRIT: 42.2 % (ref 39.0–52.0)
HEMOGLOBIN: 15.4 g/dL (ref 13.0–17.0)
Hemoglobin: 14.5 g/dL (ref 13.0–17.0)
MCH: 31.1 pg (ref 26.0–34.0)
MCH: 30.8 pg (ref 26.0–34.0)
MCHC: 34.7 g/dL (ref 30.0–36.0)
MCHC: 34.4 g/dL (ref 30.0–36.0)
MCV: 89.7 fL (ref 78.0–100.0)
MCV: 89.6 fL (ref 78.0–100.0)
PLATELETS: 196 10*3/uL (ref 150–400)
PLATELETS: 180 10*3/uL (ref 150–400)
RBC: 4.95 MIL/uL (ref 4.22–5.81)
RBC: 4.71 MIL/uL (ref 4.22–5.81)
RDW: 13.4 % (ref 11.5–15.5)
RDW: 13.4 % (ref 11.5–15.5)
WBC: 8 10*3/uL (ref 4.0–10.5)
WBC: 8.3 10*3/uL (ref 4.0–10.5)

## 2016-01-20 LAB — I-STAT CHEM 8, ED
BUN: 19 mg/dL (ref 6–20)
CHLORIDE: 103 mmol/L (ref 101–111)
CREATININE: 1 mg/dL (ref 0.61–1.24)
Calcium, Ion: 1.1 mmol/L — ABNORMAL LOW (ref 1.13–1.30)
Glucose, Bld: 109 mg/dL — ABNORMAL HIGH (ref 65–99)
HEMATOCRIT: 46 % (ref 39.0–52.0)
Hemoglobin: 15.6 g/dL (ref 13.0–17.0)
POTASSIUM: 4.1 mmol/L (ref 3.5–5.1)
SODIUM: 141 mmol/L (ref 135–145)
TCO2: 27 mmol/L (ref 0–100)

## 2016-01-20 LAB — BASIC METABOLIC PANEL
Anion gap: 12 (ref 5–15)
BUN: 16 mg/dL (ref 6–20)
CALCIUM: 9.1 mg/dL (ref 8.9–10.3)
CO2: 26 mmol/L (ref 22–32)
CREATININE: 0.93 mg/dL (ref 0.61–1.24)
Chloride: 104 mmol/L (ref 101–111)
GFR calc non Af Amer: >60 mL/min (ref >60)
Glucose, Bld: 108 mg/dL — ABNORMAL HIGH (ref 65–99)
Potassium: 4.1 mmol/L (ref 3.5–5.1)
SODIUM: 142 mmol/L (ref 135–145)

## 2016-01-20 LAB — I-STAT TROPONIN, ED: TROPONIN I, POC: 0.12 ng/mL — AB (ref 0.00–0.08)

## 2016-01-20 LAB — MRSA PCR SCREENING: MRSA by PCR: NEGATIVE

## 2016-01-20 LAB — CREATININE, SERUM
CREATININE: 0.96 mg/dL (ref 0.61–1.24)
GFR calc Af Amer: >60 mL/min (ref >60)

## 2016-01-20 LAB — TROPONIN I: Troponin I: 14.91 ng/mL (ref <0.031)

## 2016-01-20 SURGERY — LEFT HEART CATH AND CORONARY ANGIOGRAPHY
Anesthesia: LOCAL

## 2016-01-20 MED ORDER — SODIUM CHLORIDE 0.9 % IV SOLN: INTRAVENOUS | Status: DC

## 2016-01-20 MED ORDER — SODIUM CHLORIDE 0.9 % IV SOLN
250.0000 mg | INTRAVENOUS | Status: DC | PRN
  Administered 2016-01-20: 1.75 mg/kg/h via INTRAVENOUS

## 2016-01-20 MED ORDER — VERAPAMIL HCL 2.5 MG/ML IV SOLN
INTRAVENOUS | Status: AC
  Filled 2016-01-20: qty 2

## 2016-01-20 MED ORDER — HEPARIN (PORCINE) IN NACL 2-0.9 UNIT/ML-% IJ SOLN
INTRAMUSCULAR | Status: AC
  Filled 2016-01-20: qty 500

## 2016-01-20 MED ORDER — NITROGLYCERIN 1 MG/10 ML FOR IR/CATH LAB
INTRA_ARTERIAL | Status: DC | PRN
  Administered 2016-01-20: 19:00:00

## 2016-01-20 MED ORDER — HEPARIN SODIUM (PORCINE) 5000 UNIT/ML IJ SOLN
INTRAMUSCULAR | Status: AC
  Filled 2016-01-20: qty 1

## 2016-01-20 MED ORDER — VERAPAMIL HCL 2.5 MG/ML IV SOLN
INTRAVENOUS | Status: DC | PRN
  Administered 2016-01-20: 18:00:00 via INTRA_ARTERIAL

## 2016-01-20 MED ORDER — ATORVASTATIN CALCIUM 80 MG PO TABS
80.0000 mg | ORAL_TABLET | Freq: Every day | ORAL | Status: DC
  Administered 2016-01-21: 80 mg via ORAL
  Filled 2016-01-20: qty 1

## 2016-01-20 MED ORDER — ASPIRIN 81 MG PO CHEW
81.0000 mg | CHEWABLE_TABLET | Freq: Every day | ORAL | Status: DC
  Administered 2016-01-21 – 2016-01-22 (×2): 81 mg via ORAL
  Filled 2016-01-20 (×2): qty 1

## 2016-01-20 MED ORDER — ASPIRIN 81 MG PO CHEW
324.0000 mg | CHEWABLE_TABLET | Freq: Once | ORAL | Status: AC
  Administered 2016-01-20: 324 mg via ORAL

## 2016-01-20 MED ORDER — NITROGLYCERIN 1 MG/10 ML FOR IR/CATH LAB
INTRA_ARTERIAL | Status: DC | PRN
  Administered 2016-01-20: 200 ug via INTRACORONARY

## 2016-01-20 MED ORDER — TICAGRELOR 90 MG PO TABS
ORAL_TABLET | ORAL | Status: AC
  Filled 2016-01-20: qty 1

## 2016-01-20 MED ORDER — ENOXAPARIN SODIUM 40 MG/0.4ML ~~LOC~~ SOLN
40.0000 mg | SUBCUTANEOUS | Status: DC
  Administered 2016-01-21 – 2016-01-22 (×2): 40 mg via SUBCUTANEOUS
  Filled 2016-01-20 (×2): qty 0.4

## 2016-01-20 MED ORDER — SODIUM CHLORIDE 0.9% FLUSH
3.0000 mL | Freq: Two times a day (BID) | INTRAVENOUS | Status: DC
  Administered 2016-01-20 – 2016-01-21 (×3): 3 mL via INTRAVENOUS

## 2016-01-20 MED ORDER — MIDAZOLAM HCL 2 MG/2ML IJ SOLN
INTRAMUSCULAR | Status: DC | PRN
  Administered 2016-01-20: 1 mg via INTRAVENOUS

## 2016-01-20 MED ORDER — SODIUM CHLORIDE 0.9 % IV SOLN: 250.0000 mL | INTRAVENOUS | Status: DC | PRN

## 2016-01-20 MED ORDER — HEPARIN BOLUS VIA INFUSION: 60.0000 [IU]/kg | Freq: Once | INTRAVENOUS | Status: DC

## 2016-01-20 MED ORDER — HEPARIN SODIUM (PORCINE) 5000 UNIT/ML IJ SOLN: 60.0000 [IU]/kg | INTRAMUSCULAR | Status: DC

## 2016-01-20 MED ORDER — SODIUM CHLORIDE 0.9 % IV SOLN
INTRAVENOUS | Status: DC | PRN
  Administered 2016-01-20: 999 mL via INTRAVENOUS
  Administered 2016-01-20: 150 mL/h via INTRAVENOUS

## 2016-01-20 MED ORDER — IOHEXOL 350 MG/ML SOLN
INTRAVENOUS | Status: DC | PRN
  Administered 2016-01-20: 185 mL via INTRA_ARTERIAL

## 2016-01-20 MED ORDER — NITROGLYCERIN 1 MG/10 ML FOR IR/CATH LAB
INTRA_ARTERIAL | Status: AC
  Filled 2016-01-20: qty 10

## 2016-01-20 MED ORDER — TICAGRELOR 90 MG PO TABS
ORAL_TABLET | ORAL | Status: DC | PRN
  Administered 2016-01-20: 180 mg via ORAL

## 2016-01-20 MED ORDER — ASPIRIN 81 MG PO CHEW
CHEWABLE_TABLET | ORAL | Status: AC
  Filled 2016-01-20: qty 4

## 2016-01-20 MED ORDER — OXYCODONE-ACETAMINOPHEN 5-325 MG PO TABS: 1.0000 | ORAL_TABLET | ORAL | Status: DC | PRN

## 2016-01-20 MED ORDER — ACETAMINOPHEN 325 MG PO TABS: 650.0000 mg | ORAL_TABLET | ORAL | Status: DC | PRN

## 2016-01-20 MED ORDER — MIDAZOLAM HCL 2 MG/2ML IJ SOLN
INTRAMUSCULAR | Status: AC
  Filled 2016-01-20: qty 2

## 2016-01-20 MED ORDER — BIVALIRUDIN BOLUS VIA INFUSION - CUPID
INTRAVENOUS | Status: DC | PRN
  Administered 2016-01-20: 80.25 mg via INTRAVENOUS

## 2016-01-20 MED ORDER — SODIUM CHLORIDE 0.9 % WEIGHT BASED INFUSION
3.0000 mL/kg/h | INTRAVENOUS | Status: AC
Start: 2016-01-20 — End: 2016-01-20
  Administered 2016-01-20: 3 mL/kg/h via INTRAVENOUS

## 2016-01-20 MED ORDER — TICAGRELOR 90 MG PO TABS: 90.0000 mg | ORAL_TABLET | Freq: Two times a day (BID) | ORAL | Status: DC

## 2016-01-20 MED ORDER — PANTOPRAZOLE SODIUM 40 MG PO TBEC
40.0000 mg | DELAYED_RELEASE_TABLET | Freq: Every day | ORAL | Status: DC
  Administered 2016-01-21 – 2016-01-22 (×2): 40 mg via ORAL
  Filled 2016-01-20 (×2): qty 1

## 2016-01-20 MED ORDER — FENTANYL CITRATE (PF) 100 MCG/2ML IJ SOLN
INTRAMUSCULAR | Status: AC
  Filled 2016-01-20: qty 2

## 2016-01-20 MED ORDER — SODIUM CHLORIDE 0.9% FLUSH: 3.0000 mL | INTRAVENOUS | Status: DC | PRN

## 2016-01-20 MED ORDER — BIVALIRUDIN 250 MG IV SOLR
INTRAVENOUS | Status: AC
  Filled 2016-01-20: qty 250

## 2016-01-20 MED ORDER — HEPARIN BOLUS VIA INFUSION
4000.0000 [IU] | Freq: Once | INTRAVENOUS | Status: DC
  Administered 2016-01-20: 4000 [IU] via INTRAVENOUS
  Filled 2016-01-20: qty 4000

## 2016-01-20 MED ORDER — HEPARIN (PORCINE) IN NACL 2-0.9 UNIT/ML-% IJ SOLN
INTRAMUSCULAR | Status: AC
  Filled 2016-01-20: qty 1000

## 2016-01-20 MED ORDER — INFLUENZA VAC SPLIT QUAD 0.5 ML IM SUSY
0.5000 mL | PREFILLED_SYRINGE | INTRAMUSCULAR | Status: AC
  Administered 2016-01-21: 0.5 mL via INTRAMUSCULAR
  Filled 2016-01-20: qty 0.5

## 2016-01-20 MED ORDER — LIDOCAINE HCL (PF) 1 % IJ SOLN
INTRAMUSCULAR | Status: AC
  Filled 2016-01-20: qty 30

## 2016-01-20 MED ORDER — MORPHINE SULFATE (PF) 2 MG/ML IV SOLN: 2.0000 mg | INTRAVENOUS | Status: DC | PRN

## 2016-01-20 MED ORDER — FENTANYL CITRATE (PF) 100 MCG/2ML IJ SOLN
INTRAMUSCULAR | Status: DC | PRN
  Administered 2016-01-20 (×2): 50 ug via INTRAVENOUS

## 2016-01-20 MED ORDER — ONDANSETRON HCL 4 MG/2ML IJ SOLN: 4.0000 mg | Freq: Four times a day (QID) | INTRAMUSCULAR | Status: DC | PRN

## 2016-01-20 SURGICAL SUPPLY — 19 items
BALLN EMERGE MR 3.0X12 (BALLOONS) ×2
BALLN ~~LOC~~ EMERGE MR 3.75X12 (BALLOONS) ×2
BALLOON EMERGE MR 3.0X12 (BALLOONS) IMPLANT
BALLOON ~~LOC~~ EMERGE MR 3.75X12 (BALLOONS) IMPLANT
CATH INFINITI 5 FR JL3.5 (CATHETERS) ×2 IMPLANT
CATH INFINITI JR4 5F (CATHETERS) ×2 IMPLANT
CATH VISTA GUIDE 6FR XBRCA (CATHETERS) ×1 IMPLANT
DEVICE RAD COMP TR BAND LRG (VASCULAR PRODUCTS) ×2 IMPLANT
GLIDESHEATH SLEND A-KIT 6F 22G (SHEATH) ×2 IMPLANT
GUIDE CATH RUNWAY 6FR FR4 (CATHETERS) ×1 IMPLANT
KIT ENCORE 26 ADVANTAGE (KITS) ×1 IMPLANT
KIT HEART LEFT (KITS) ×2 IMPLANT
PACK CARDIAC CATHETERIZATION (CUSTOM PROCEDURE TRAY) ×2 IMPLANT
STENT PROMUS PREM MR 3.5X16 (Permanent Stent) ×1 IMPLANT
STENT PROMUS PREM MR 4.0X16 (Permanent Stent) ×1 IMPLANT
TRANSDUCER W/STOPCOCK (MISCELLANEOUS) ×2 IMPLANT
TUBING CIL FLEX 10 FLL-RA (TUBING) ×2 IMPLANT
WIRE RUNTHROUGH .014X180CM (WIRE) ×1 IMPLANT
WIRE SAFE-T 1.5MM-J .035X260CM (WIRE) ×2 IMPLANT

## 2016-01-20 NOTE — ED Provider Notes (Addendum)
CSN: [id number]     Arrival date & time 01/20/16  1720 History   First MD Initiated Contact with Patient 01/20/16 1730     Chief Complaint  Patient presents with  . Chest Pain     (Consider location/radiation/quality/duration/timing/severity/associated sxs/prior Treatment) HPI Comments: 75 y/o M comes in with chest pain. Hx of CAD s/p stents 12 years ago. Reports that he has been having some off and on chest pain for the past few days. Today, he started having pressure like chest pain 1 hour ago, with radiation to the R side. + associated dib, nausea, emesis.  Patient is a 75 y.o. male presenting with chest pain. The history is provided by the patient and medical records.  Chest Pain Associated symptoms: nausea and vomiting     Past Medical History  Diagnosis Date  . Esophageal abnormality   . GERD (gastroesophageal reflux disease)   . Hypercholesteremia   . Coronary artery disease   . Hypertension    Past Surgical History  Procedure Laterality Date  . Coronary stent placement    . Coronary angioplasty    . Esophagogastroduodenoscopy (egd) with esophageal dilation N/A 05/09/2013    Procedure: ESOPHAGOGASTRODUODENOSCOPY (EGD) WITH ESOPHAGEAL DILATION;  Surgeon: Theda Belfast, MD;  Location: [facility];  Service: Endoscopy;  Laterality: N/A;   No family history on file. Social History  Substance Use Topics  . Smoking status: Former Smoker    Types: Cigarettes    Quit date: 11/13/1983  . Smokeless tobacco: None  . Alcohol Use: 2.0 oz/week    4 drink(s) per week    Review of Systems  Unable to perform ROS: Acuity of condition  Cardiovascular: Positive for chest pain.  Gastrointestinal: Positive for nausea and vomiting.      Allergies  Doxycycline  Home Medications   Prior to Admission medications   Medication Sig Start Date End Date Taking? Authorizing Provider  clopidogrel (PLAVIX) 75 MG tablet TAKE ONE TABLET BY MOUTH EVERY DAY 12/20/13   Runell Gess, MD   esomeprazole (NEXIUM) 40 MG capsule Take 40 mg by mouth daily before breakfast.    Historical Provider, MD  nitroGLYCERIN (NITROLINGUAL) 0.4 MG/SPRAY spray Place 1 spray under the tongue every 5 (five) minutes x 3 doses as needed for chest pain. 11/12/13   Runell Gess, MD  simvastatin (ZOCOR) 80 MG tablet Take 1 tablet (80 mg total) by mouth every evening. 07/08/13   Runell Gess, MD   BP 149/92 mmHg  Pulse 77  Temp(Src) 98.1 F (36.7 C) (Oral)  Resp 16  Ht 6' (1.829 m)  Wt 236 lb (107.049 kg)  BMI 32.00 kg/m2  SpO2 96% Physical Exam  Constitutional: He is oriented to person, place, and time. He appears well-developed.  HENT:  Head: Atraumatic.  Neck: Neck supple.  Cardiovascular: Normal rate.   Pulmonary/Chest: Effort normal.  Neurological: He is alert and oriented to person, place, and time.  Skin: Skin is warm.  Nursing note and vitals reviewed.   ED Course  Procedures (including critical care time) Labs Review Labs Reviewed  CBC  BASIC METABOLIC PANEL  I-STAT TROPOININ, ED    Imaging Review No results found. I have personally reviewed and evaluated these images and lab results as part of my medical decision-making.   EKG Interpretation   Date/Time:  Saturday January 20 2016 17:25:12 EST Ventricular Rate:  77 PR Interval:  156 QRS Duration: 104 QT Interval:  372 QTC Calculation: 420 R Axis:   12  Text Interpretation:  Normal sinus rhythm ST elevation consider inferior  injury or acute infarct Consider right ventricular involvement in acute  inferior infarct Abnormal ECG STEMI called Reconfirmed by Rhunette Croft, MD,  Alden Feagan [phone]) on 01/20/2016 5:32:40 PM      EKG Interpretation  Date/Time:  Saturday January 20 2016 17:37:46 EST Ventricular Rate:  79 PR Interval:  156 QRS Duration: 104 QT Interval:  352 QTC Calculation: 403 R Axis:   71 Text Interpretation:  Sinus rhythm Inferior infarct, acute (RCA) Probable RV involvement, suggest recording  right precordial leads worsening ST elevation in the inferior leads Confirmed by Ivi Griffith, MD, Janey Genta ([id number]) on 01/20/2016 5:43:59 PM         MDM   Final diagnoses:  ST elevation myocardial infarction (STEMI), unspecified artery (HCC)    Pt comes in with cc of chest pain. Hx of CAD. Pt has STEMI, inferior. Cath activated.  CRITICAL CARE Performed by: Derwood Kaplan   Total critical care time: 15  minutes  Critical care time was exclusive of separately billable procedures and treating other patients.  Critical care was necessary to treat or prevent imminent or life-threatening deterioration.  Critical care was time spent personally by me on the following activities: development of treatment plan with patient and/or surrogate as well as nursing, discussions with consultants, evaluation of patient's response to treatment, examination of patient, obtaining history from patient or surrogate, ordering and performing treatments and interventions, ordering and review of laboratory studies, ordering and review of radiographic studies, pulse oximetry and re-evaluation of patient's condition.   Derwood Kaplan, MD 01/20/16 1744  Derwood Kaplan, MD 01/20/16 7425

## 2016-01-20 NOTE — ED Notes (Signed)
Notified MD of 0.12 troponin.

## 2016-01-20 NOTE — Progress Notes (Signed)
   01/20/16 1823  Clinical Encounter Type  Visited With Family;Health care provider  Visit Type Initial;Code;ED  Referral From Physician   Chaplain responded to a code STEMI in the ED. Chaplain offered support for the patient's wife, and helped her transition to the Cath Lab waiting area. Chaplain support available as needed.   Alda Ponder, Chaplain  01/20/2016 6:24 PM

## 2016-01-20 NOTE — ED Notes (Signed)
[facility] contacted to page Code Stemi 

## 2016-01-20 NOTE — Progress Notes (Addendum)
ANTICOAGULATION CONSULT NOTE - Initial Consult  Pharmacy Consult for heparin Indication: chest pain/ACS  Allergies  Allergen Reactions  . Doxycycline     REACTION: nausea, vommiting    Patient Measurements: Height: 6' (182.9 cm) Weight: 236 lb (107.049 kg) IBW/kg (Calculated) : 77.6 Heparin Dosing Weight: 100 kg  Vital Signs: Temp: 98.1 F (36.7 C) (02/25 1740) Temp Source: Oral (02/25 1740) BP: 149/92 mmHg (02/25 1725) Pulse Rate: 77 (02/25 1725)  Labs:  Recent Labs  01/20/16 1725  HGB 15.4  HCT 44.4  PLT 196    CrCl cannot be calculated (Patient has no serum creatinine result on file.).   Medical History: Past Medical History  Diagnosis Date  . Esophageal abnormality   . GERD (gastroesophageal reflux disease)   . Hypercholesteremia   . Coronary artery disease   . Hypertension     Medications:   (Not in a hospital admission)  Assessment: 75 year old M w/ hx of CAD and HTN presenting with dull chest pain x1 hr. No relief from ASA 81 x2, nitro SL and Rolaid tablet. EKG showing ST elevation. CODE STEMI called, pt to be taken to emergent cath.  Hgb 15.4, Plt 196  Goal of Therapy:  Heparin level 0.3-0.7 units/ml Monitor platelets by anticoagulation protocol: Yes   Plan:  -Give 4000 units bolus x 1 -Start heparin infusion at 1400 units/hr -Check anti-Xa level in 8 hours and daily while on heparin -Continue to monitor H&H and platelets -F/U after cath  Arcola Jansky, PharmD Clinical Pharmacy Resident Pager: [phone] 01/20/2016,5:48 PM   ADDENDUM --------------------------------------------------------------------------------------------------  - consult per pharmacy cancelled. Orders removed  Arcola Jansky, PharmD Clinical Pharmacy Resident Pager: [phone] 5:52 PM

## 2016-01-20 NOTE — ED Notes (Addendum)
Pt here for central dull chest pain onset one hour PTA associated with SOB, nausea, and vomiting. Emesis x 2 today.  Pt reports one nitro SL tab, two 81 aspirin tabs and one Rolaid pill PTA with only minimal relief.

## 2016-01-20 NOTE — H&P (Signed)
CARDIOLOGY INPATIENT HISTORY AND PHYSICAL EXAMINATION NOTE  Patient ID: Nicholas Reilly MRN: [id number], DOB/AGE: 06/01/1941   Admit date: 01/20/2016   Primary Physician: No PCP Per Patient Primary Cardiologist: Nanetta Batty MD  Reason for admission: inferior STEMI  HPI: This is a 75 y.o.Caucasian male with known history of unstable angina status post stenting of the RCA and circumflex coronary arteries by Dr. Allyson Sabal on 05/07/05 with cypher 3.0 x 28 and 3.0  X 23 mm stents. He was recathed a year later after a false positive Myoview revealing patent stents with normal LV function. He denies chest pain or shortness of breath.his last Myoview performed 02/21/10 was nonischemic. He moved to [place] and was following up with a cardiologist there. Patient was visiting his grandchildren with his wife and started having chest pain around 4 PM while he was sitting. This chest pain was similar to the pain he had 11 years ago. The chest pain was dull, tight in character, 10/10 in intensity, radiated to the left arm without diaphoresis or SOB. It did not improve with NTG when he took it but started easing off when he came to the ED. In the ED, he is currently reporting the pain as 1-2/10. Patient said that he had similar pain last week when he was playing a game. His plavix was stopped last year due to bruising on the arms by his cardiologist. He did not have any recent stress test but was scheduled to have one next week. He has been having off and on chest pains in the meanwhile.  Of note, patient has a history of dyphagia and esophageal dilatation due to food impaction int he past by GI. Patient was doing some framing work in the bathroom yesterday.  Patient is currently hemodynamically stable and will be transported for cardiac catheterization lab. He had a vasovagal episode on golding manual pressure last time.   Problem List: Past Medical History  Diagnosis Date  . Esophageal abnormality   .  GERD (gastroesophageal reflux disease)   . Hypercholesteremia   . Coronary artery disease   . Hypertension     Past Surgical History  Procedure Laterality Date  . Coronary stent placement    . Coronary angioplasty    . Esophagogastroduodenoscopy (egd) with esophageal dilation N/A 05/09/2013    Procedure: ESOPHAGOGASTRODUODENOSCOPY (EGD) WITH ESOPHAGEAL DILATION;  Surgeon: Theda Belfast, MD;  Location: [facility];  Service: Endoscopy;  Laterality: N/A;     Allergies:  Allergies  Allergen Reactions  . Doxycycline     REACTION: nausea, vommiting     Home Medications Current Facility-Administered Medications  Medication Dose Route Frequency Provider Last Rate Last Dose  . 0.9 %  sodium chloride infusion   Intravenous Continuous Derwood Kaplan, MD       Current Outpatient Prescriptions  Medication Sig Dispense Refill  . clopidogrel (PLAVIX) 75 MG tablet TAKE ONE TABLET BY MOUTH EVERY DAY 30 tablet 11  . esomeprazole (NEXIUM) 40 MG capsule Take 40 mg by mouth daily before breakfast.    . nitroGLYCERIN (NITROLINGUAL) 0.4 MG/SPRAY spray Place 1 spray under the tongue every 5 (five) minutes x 3 doses as needed for chest pain. 12 g 12  . simvastatin (ZOCOR) 80 MG tablet Take 1 tablet (80 mg total) by mouth every evening. 30 tablet 4     Family history Colon cancer in 1st degree relative <62 years of age and hypertension  Social History   Social History  . Marital Status: Married  Spouse Name: N/A  . Number of Children: N/A  . Years of Education: N/A   Occupational History  . Not on file.   Social History Main Topics  . Smoking status: Former Smoker    Types: Cigarettes    Quit date: 11/13/1983  . Smokeless tobacco: Not on file  . Alcohol Use: 2.0 oz/week    4 drink(s) per week  . Drug Use: No  . Sexual Activity: Not on file   Other Topics Concern  . Not on file   Social History Narrative     Review of Systems: General: negative for chills, fever, night  sweats or weight changes.  Cardiovascular: chest pain, negative for dyspnea on exertion, edema, orthopnea, palpitations, paroxysmal nocturnal dyspnea or shortness of breath  Dermatological: negative for rash Respiratory: negative for cough or wheezing Urologic: negative for hematuria Abdominal: negative for nausea, vomiting, diarrhea, bright red blood per rectum, melena, or hematemesis Neurologic: negative for visual changes, syncope, or dizziness Endocrine: no diabetes, no hypothyroidism Immunological: no lymph adenopathy Psych: non homicidal/suicidal  Physical Exam: Vitals: BP 149/92 mmHg  Pulse 77  Temp(Src) 98.1 F (36.7 C) (Oral)  Resp 16  Ht 6' (1.829 m)  Wt 107.049 kg (236 lb)  BMI 32.00 kg/m2  SpO2 96% General: not in acute distress Neck: JVP flat, neck supple Heart: regular rate and rhythm, S1, S2, no murmurs  Lungs: CTAB  GI: non tender, non distended, bowel sounds present Extremities: no edema Neuro: AAO x 3  Psych: normal affect, no anxiety   Labs:   Results for orders placed or performed during the hospital encounter of 01/20/16 (from the past 24 hour(s))  CBC     Status: None   Collection Time: 01/20/16  5:25 PM  Result Value Ref Range   WBC 8.0 4.0 - 10.5 K/uL   RBC 4.95 4.22 - 5.81 MIL/uL   Hemoglobin 15.4 13.0 - 17.0 g/dL   HCT 40.9 81.1 - 91.4 %   MCV 89.7 78.0 - 100.0 fL   MCH 31.1 26.0 - 34.0 pg   MCHC 34.7 30.0 - 36.0 g/dL   RDW 78.2 95.6 - 21.3 %   Platelets 196 150 - 400 K/uL  I-stat troponin, ED (not at [facility])     Status: Abnormal   Collection Time: 01/20/16  5:33 PM  Result Value Ref Range   Troponin i, poc 0.12 (HH) 0.00 - 0.08 ng/mL   Comment NOTIFIED PHYSICIAN    Comment 3          I-stat chem 8, ed     Status: Abnormal   Collection Time: 01/20/16  5:35 PM  Result Value Ref Range   Sodium 141 135 - 145 mmol/L   Potassium 4.1 3.5 - 5.1 mmol/L   Chloride 103 101 - 111 mmol/L   BUN 19 6 - 20 mg/dL   Creatinine, Ser 0.86 0.61 - 1.24  mg/dL   Glucose, Bld 578 (H) 65 - 99 mg/dL   Calcium, Ion 4.69 (L) 1.13 - 1.30 mmol/L   TCO2 27 0 - 100 mmol/L   Hemoglobin 15.6 13.0 - 17.0 g/dL   HCT 62.9 52.8 - 41.3 %     Radiology/Studies: No results found.  EKG: normal sinus rhythm, Q wave in the lead III and ST elevation in the inferior leads  Echo: no recent echo  Cardiac cath: 05/07/2005 RCA and LCx stenting   Medical decision making:  Discussed care with the patient Discussed care with the ED physician face to  face Reviewed labs and imaging personally Reviewed prior records  ASSESSMENT AND PLAN:  This is a 75 y.o. male with known CAD s/p PCI in 2006 presented with STEMI with ongoing chest pain. Initial troponin is 1.1. Patient stopped plavix last year.    Active Problems:   ST elevation (STEMI) myocardial infarction involving other coronary artery of inferior wall (HCC)   CAD in native artery   Hypertension, essential   Dysphagia  ST elevation MI - inferior LV wall 1. Emergent cardiac catheterization and PCI 2. Risk stratify with HbA1c, lipid profile, TSH 3. Echocardiogram in the morning 4. Aspirin, antiplatelet, continue lipitor 80 mg daily and beta blocker if tolerated 5. Medication compliance emphasized to the patient  Esophageal Dysphagia - planned to have esophageal dilatation next week which was cancelled due to planned strss test due to having ongoing chest pains Mild and able to take medications  HTN - continue  GERD - continue PPI  HLD - continue lipitor 80 mg daily Psoriasis - continue methotrexate    Signed, Joellyn Rued, MD MS 01/20/2016, 6:01 PM

## 2016-01-21 DIAGNOSIS — I2119 ST elevation (STEMI) myocardial infarction involving other coronary artery of inferior wall: Secondary | ICD-10-CM | POA: Diagnosis not present

## 2016-01-21 LAB — CBC
HCT: 41.3 % (ref 39.0–52.0)
HEMOGLOBIN: 14.1 g/dL (ref 13.0–17.0)
MCH: 30.9 pg (ref 26.0–34.0)
MCHC: 34.1 g/dL (ref 30.0–36.0)
MCV: 90.4 fL (ref 78.0–100.0)
PLATELETS: 169 10*3/uL (ref 150–400)
RBC: 4.57 MIL/uL (ref 4.22–5.81)
RDW: 13.5 % (ref 11.5–15.5)
WBC: 8.3 10*3/uL (ref 4.0–10.5)

## 2016-01-21 LAB — BASIC METABOLIC PANEL
Anion gap: 9 (ref 5–15)
BUN: 10 mg/dL (ref 6–20)
CO2: 26 mmol/L (ref 22–32)
CREATININE: 0.88 mg/dL (ref 0.61–1.24)
Calcium: 8.5 mg/dL — ABNORMAL LOW (ref 8.9–10.3)
Chloride: 108 mmol/L (ref 101–111)
GFR calc Af Amer: >60 mL/min (ref >60)
GFR calc non Af Amer: >60 mL/min (ref >60)
Glucose, Bld: 118 mg/dL — ABNORMAL HIGH (ref 65–99)
POTASSIUM: 3.7 mmol/L (ref 3.5–5.1)
Sodium: 143 mmol/L (ref 135–145)

## 2016-01-21 LAB — TROPONIN I: Troponin I: 43.1 ng/mL (ref <0.031)

## 2016-01-21 MED ORDER — TICAGRELOR 90 MG PO TABS
90.0000 mg | ORAL_TABLET | Freq: Two times a day (BID) | ORAL | Status: DC
  Administered 2016-01-21 – 2016-01-22 (×3): 90 mg via ORAL
  Filled 2016-01-21 (×3): qty 1

## 2016-01-21 NOTE — Progress Notes (Signed)
Received to 3W21 via w/c from 2 heart. Denies CP, SOB or other c/o at this time. Rt. Radial site benign s/s bleed, bruise or hematoma with bandaid intact. Oriented to room/unit. Call bell in reach. Instructed to call prn needs or other. Verbalized understanding. No questions or concerns expressed at this time.

## 2016-01-21 NOTE — Progress Notes (Signed)
Utilization review completed.  

## 2016-01-21 NOTE — Progress Notes (Signed)
01/20/16 2038  Troponin I 14.91  Expected values from Stemi. Dr. Katrinka Blazing aware. Eleah Lahaie, Linnell Fulling

## 2016-01-21 NOTE — Progress Notes (Signed)
Attempted to call report to [address]. Nurse unable to get report at this time.

## 2016-01-21 NOTE — Progress Notes (Signed)
Subjective:   Admitted overnight with inferior stemi. Had emergent cath with PCI/DES x 2  of T RCA. Cath results as below.  No CP or SOB.   Cath 2/25   Acute inferior ST elevation myocardial infarction due to occlusion of the mid right coronary beyond a previously placed mid vessel drug-eluting stent. There is also high-grade obstruction in the proximal RCA, proximal to the previously placed stent. Left-to-right collaterals in the distal right coronary bed were noted with left coronary injections.  Successful angioplasty and stenting of both the RCA mid vessel total occlusion and the proximal 85% stenosis reducing both areas to 0% with TIMI grade 3 flow using using a 3.5 x 16 Promus Premier (postdilated to 3.75 mm) in the mid lesion and a 4.0 x 16 Promus Premier in the proximal lesion.  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  Widely patent left anterior descending.  40% distal left main stenosis.  50% mid ramus intermedius stenosis.  Severe inferolateral basal and mid inferior wall hypokinesis/akinesis. Ejection fraction 45-50%. Significant elevation LVEDP of 30 mmHg.     Intake/Output Summary (Last 24 hours) at 01/21/16 1136 Last data filed at 01/21/16 0800  Gross per 24 hour  Intake 1888.6 ml  Output   4475 ml  Net -2586.4 ml    Current meds: . aspirin  81 mg Oral Daily  . atorvastatin  80 mg Oral q1800  . enoxaparin (LOVENOX) injection  40 mg Subcutaneous Q24H  . Influenza vac split quadrivalent PF  0.5 mL Intramuscular Tomorrow-1000  . pantoprazole  40 mg Oral Daily  . sodium chloride flush  3 mL Intravenous Q12H  . ticagrelor  90 mg Oral BID   Infusions:     Objective:  Blood pressure 116/65, pulse 61, temperature 98.4 F (36.9 C), temperature source Oral, resp. rate 11, height 6' (1.829 m), weight 106.5 kg (234 lb 12.6 oz), SpO2 98 %. Weight change:   Physical Exam: General:  Well appearing. No resp difficulty HEENT:  normal Neck: supple. JVP 5-6  . Carotids 2+ bilat; no bruits. No lymphadenopathy or thryomegaly appreciated. Cor: PMI nondisplaced. Regular rate & rhythm. No rubs, gallops or murmurs. Lungs: clear Abdomen: soft, nontender, nondistended. No hepatosplenomegaly. No bruits or masses. Good bowel sounds. Extremities: no cyanosis, clubbing, rash, edema Neuro: alert & orientedx3, cranial nerves grossly intact. moves all 4 extremities w/o difficulty. Affect pleasant  Telemetry: SR 60s  Lab Results: Basic Metabolic Panel:  Recent Labs Lab 01/20/16 1725 01/20/16 1735 01/20/16 1932 01/21/16 0212  NA 142 141  --  143  K 4.1 4.1  --  3.7  CL 104 103  --  108  CO2 26  --   --  26  GLUCOSE 108* 109*  --  118*  BUN 16 19  --  10  CREATININE 0.93 1.00 0.96 0.88  CALCIUM 9.1  --   --  8.5*   Liver Function Tests: No results for input(s): AST, ALT, ALKPHOS, BILITOT, PROT, ALBUMIN in the last 168 hours. No results for input(s): LIPASE, AMYLASE in the last 168 hours. No results for input(s): AMMONIA in the last 168 hours. CBC:  Recent Labs Lab 01/20/16 1725 01/20/16 1735 01/20/16 1932 01/21/16 0212  WBC 8.0  --  8.3 8.3  HGB 15.4 15.6 14.5 14.1  HCT 44.4 46.0 42.2 41.3  MCV 89.7  --  89.6 90.4  PLT 196  --  180 169   Cardiac Enzymes:  Recent Labs Lab  01/20/16 1932 01/21/16 0212 01/21/16 0724  TROPONINI 14.91* 81.90* 43.10*   BNP: Invalid input(s): POCBNP CBG: No results for input(s): GLUCAP in the last 168 hours. Microbiology: Lab Results  Component Value Date   CULT No Fungi Isolated in 4 Weeks 11/11/2008   CULT NO GROWTH 2 DAYS 11/11/2008   CULT NO GROWTH 2 DAYS 11/11/2008   CULT NO ANAEROBES ISOLATED 11/11/2008   CULT  11/11/2008    MYCOBACTERIUM MARINUM Note: IDENTIFIED BY [facility] CRITICAL RESULT CALLED TO, READ BACK BY AND VERIFIED WITH: DR HATCHER @ 15:15 12/16/08 GF SEE SEPERATE REPORT FOR SUSCEPTIBILITIES BY [facility].    CULT No Fungi Isolated in 4 Weeks 11/11/2008   CULT NO GROWTH 2 DAYS 11/11/2008   CULT NO ANAEROBES ISOLATED 11/11/2008   CULT NO ACID FAST BACILLI ISOLATED IN 6 WEEKS 11/11/2008   CULT No Fungi Isolated in 4 Weeks 11/11/2008   No results for input(s): CULT, SDES in the last 168 hours.  Imaging: Dg Chest Port 1 View  01/20/2016  CLINICAL DATA:  Mid sternal chest pressure, vomiting EXAM: PORTABLE CHEST 1 VIEW COMPARISON:  05/09/2013 FINDINGS: Bibasilar opacities, likely atelectasis. Heart is normal size. Low lung volumes. No effusions or acute bony abnormality. IMPRESSION: Low lung volumes, bibasilar atelectasis. Electronically Signed   By: Charlett Nose M.D.   On: 01/20/2016 18:11     ASSESSMENT:  1. Inferior STEMI s/p PCI with DES to RCA 2/25 2. CAD 3. HTN 4. HL 5. GERD   PLAN/DISCUSSION:  Doing well post-PCI. Continue Brilinta, ASA, statin. CR to see. Can go to tele. Probably needs lifelong DAPT.  Hopefully home in am.    LOS: 1 day    Arvilla Meres, MD 01/21/2016, 11:36 AM

## 2016-01-22 ENCOUNTER — Encounter (HOSPITAL_COMMUNITY): Payer: Self-pay | Admitting: Interventional Cardiology

## 2016-01-22 ENCOUNTER — Inpatient Hospital Stay (HOSPITAL_COMMUNITY): Payer: Medicare Other

## 2016-01-22 ENCOUNTER — Telehealth: Payer: Self-pay | Admitting: Cardiovascular Disease

## 2016-01-22 LAB — POCT ACTIVATED CLOTTING TIME: Activated Clotting Time: 358 seconds

## 2016-01-22 LAB — BRAIN NATRIURETIC PEPTIDE: B Natriuretic Peptide: 163.8 pg/mL — ABNORMAL HIGH (ref 0.0–100.0)

## 2016-01-22 LAB — TROPONIN I

## 2016-01-22 MED ORDER — TICAGRELOR 90 MG PO TABS: 90.0000 mg | ORAL_TABLET | Freq: Two times a day (BID) | ORAL | Status: DC

## 2016-01-22 MED ORDER — NITROGLYCERIN 0.4 MG/SPRAY TL SOLN: 1.0000 | Status: DC | PRN

## 2016-01-22 MED ORDER — NEBIVOLOL HCL 5 MG PO TABS
5.0000 mg | ORAL_TABLET | Freq: Every day | ORAL | Status: DC
  Administered 2016-01-22: 5 mg via ORAL
  Filled 2016-01-22: qty 1

## 2016-01-22 NOTE — Progress Notes (Signed)
       Patient Name: Nicholas Reilly Date of Encounter: 01/22/2016    SUBJECTIVE: Nicholas Reilly is doing well. No angina or access site complications. Significant troponin bump. No heart failure complaints.  TELEMETRY:  Normal sinus rhythm.: Filed Vitals:   01/21/16 1600 01/21/16 1903 01/21/16 2100 01/22/16 0500  BP: 123/62 139/68 137/65 106/62  Pulse:  84 78 65  Temp: 98.3 F (36.8 C) 98.6 F (37 C) 99.2 F (37.3 C) 98.2 F (36.8 C)  TempSrc: Oral Oral    Resp: Height:  6' (1.829 m)    Weight:  234 lb (106.142 kg)  239 lb 1.6 oz (108.455 kg)  SpO2:  96% 96% 100%    Intake/Output Summary (Last 24 hours) at 01/22/16 0746 Last data filed at 01/22/16 0500  Gross per 24 hour  Intake   1200 ml  Output    425 ml  Net    775 ml   LABS: Basic Metabolic Panel:  Recent Labs  40/98/11 1725 01/20/16 1735 01/20/16 1932 01/21/16 0212  NA 142 141  --  143  K 4.1 4.1  --  3.7  CL 104 103  --  108  CO2 26  --   --  26  GLUCOSE 108* 109*  --  118*  BUN 16 19  --  10  CREATININE 0.93 1.00 0.96 0.88  CALCIUM 9.1  --   --  8.5*   CBC:  Recent Labs  01/20/16 1932 01/21/16 0212  WBC 8.3 8.3  HGB 14.5 14.1  HCT 42.2 41.3  MCV 89.6 90.4  PLT 180 169   Cardiac Enzymes:  Recent Labs  01/20/16 1932 01/21/16 0212 01/21/16 0724  TROPONINI 14.91* 81.90* 43.10*   BNP No results found for: BNP  ProBNP No results found for: PROBNP    Radiology/Studies:  No acute pulmonary abnormalities were noted on an x-ray done 2014. He needs to have repeat study.  Physical Exam: Blood pressure 106/62, pulse 65, temperature 98.2 F (36.8 C), temperature source Oral, resp. rate 21, height 6' (1.829 m), weight 239 lb 1.6 oz (108.455 kg), SpO2 100 %. Weight change: -2 lb (-0.907 kg)  Wt Readings from Last 3 Encounters:  01/22/16 239 lb 1.6 oz (108.455 kg)  11/12/13 242 lb 1.6 oz (109.816 kg)  05/09/13 226 lb (102.513 kg)   Obese gentleman. No acute distress. Chest  is clear to auscultation and percussion. Cardiac exam reveals an S4 gallop. Extremities reveal no edema. Right radial access site is unremarkable. Neurological exam is unremarkable.  ASSESSMENT:  1. Acute inferior infarction due to de novo disease in the proximal and distal RCA treated with stenting. 2. 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 3. Acute on chronic diastolic heart failure. 4. Hyperlipidemia 5. Essential hypertension  Plan:  1. Chest x-ray today 2. Check BNP 3. Ambulate and instruction per cardiac rehabilitation 4. Likely discharge later today.  Selinda Eon 01/22/2016, 7:46 AM

## 2016-01-22 NOTE — Progress Notes (Signed)
CARDIAC REHAB PHASE I   PRE:  Rate/Rhythm: 90 SR  BP:  Sitting: 148/70        SaO2: 97 RA  MODE:  Ambulation: 550 ft   POST:  Rate/Rhythm: 101 ST  BP:  Sitting: 169/77         SaO2: 100 RA  Pt ambulated 550  ft on RA, independent, steady gait, tolerated well.  Pt c/o of mild DOE, denies cp, dizziness, declined rest stop. Completed MI/stent education with pt and wife at bedside.  Reviewed risk factors, anti-platelet therapy, stent card, activity restrictions, ntg, exercise, heart healthy diet, portion control, s/s heart failure, self-monitoring, daily weights, sodium restrictions and phase 2 cardiac rehab. Pt verbalized understanding. Pt agrees to phase 2 cardiac rehab. Will send referral to [facility] in [facility]. Pt to recliner after walk, call bell within reach, eager to go home.    [phone] Joylene Grapes, RN, BSN 01/22/2016 10:35 AM

## 2016-01-22 NOTE — Care Management Note (Addendum)
Case Management Note  Patient Details  Name: LERONE ONDER MRN: [id number] Date of Birth: 1941/01/10  Subjective/Objective:    Pt admitted for cp- post Stemi. Plan for d/c today on Brilinta. Benefits check in process and will make pt aware once completed.                 Action/Plan:Pt uses [facility] on [address] and medication is not available. CM did call [facility] on [address] and medication is available. No further needs from CM at this time.    Expected Discharge Date:  01/23/16               Expected Discharge Plan:  Home/Self Care  In-House Referral:  NA  Discharge planning Services  CM Consult, Medication Assistance  Post Acute Care Choice:  NA Choice offered to:  NA  DME Arranged:  N/A DME Agency:  NA  HH Arranged:  NA HH Agency:  NA  Status of Service:  Completed, signed off  Medicare Important Message Given:    Date Medicare IM Given:    Medicare IM give by:    Date Additional Medicare IM Given:    Additional Medicare Important Message give by:     If discussed at Long Length of Stay Meetings, dates discussed:    Additional Comments:  Gala Lewandowsky, RN 01/22/2016, 10:18 AM

## 2016-01-22 NOTE — Plan of Care (Signed)
Problem: Consults Goal: Cardiac Cath Patient Education (See Patient Education module for education specifics.)  Outcome: Completed/Met Date Met:  01/22/16 Patient is status-post cardiac catheterization for STEMI with two drug-eluting stents placed in the RCA. Right radial catheterization site is level 0 with no evidence of complications. Started on brilinta post-cath; tolerating so far without adverse effects or signs of bleeding.  No complaint of chest pain, SOB or other problems after PCI. Ambulating frequently in room; SCDs on at HS per orders.  Vital signs have been stable with mild low-grade temperature elevation since transfer from 2[facility]Heart: Filed Vitals:    01/21/16 1200 01/21/16 1600 01/21/16 1903 01/21/16 2100  BP:   123/62 139/68 137/65  Pulse:     84 78  Temp: 98.4 F (36.9 C) 98.3 F (36.8 C) 98.6 F (37 C) 99.2 F (37.3 C)  TempSrc: Oral Oral Oral    Resp: _0 Height:     6' (1.829 m)    Weight:     106.142 kg (234 lb)    SpO2:     96% 96%    Patient was early to bed this evening and stated that he had been previously been educated concerning the plan of care, medications, his cardiac catheterization and post care. I was able to briefly educate him concerning a few of his ordered medications (Brilinta, Protonix and aspirin), use of SCDs overnight and likely discharge plan for AM.  Will need stent card and discharge instructions with AVS. No other educational needs identified at this time.  Patient appears to be on-target for discharge in AM.

## 2016-01-22 NOTE — Progress Notes (Signed)
Patient discharge instructions gone over with patient in detail. All questions answered to patients satisfaction. IV removed intact, Telemetry discontinued. Patient discharged to home with wife by way of wheelchair.

## 2016-01-22 NOTE — Discharge Summary (Signed)
Discharge Summary    Patient ID: Nicholas Reilly,  MRN: [id number], DOB/AGE: [birth year] 75 y.o.  Admit date: 01/20/2016 Discharge date: 01/22/2016  Primary Care Provider: No PCP Per Patient Primary Cardiologist: Dr Allyson Sabal  Discharge Diagnoses    Principal Problem:   ST elevation (STEMI) myocardial infarction involving other coronary artery of inferior wall (HCC) Active Problems:   Hyperlipidemia   CAD in native artery   Hypertension, essential   Dysphagia   Acute ST elevation myocardial infarction (STEMI) (HCC)   Allergies Allergies  Allergen Reactions  . Doxycycline Nausea And Vomiting  . Other Other (See Comments)    Several foods especially spicy foods cause congestion    Diagnostic Studies/Procedures    Cardiac Cath 1. Ramus lesion, 65% stenosed. 2. Prox Cx to Mid Cx lesion, 100% stenosed. The lesion was previously treated with a stent (unknown type). 3. Prox RCA to Mid RCA lesion, 25% stenosed. The lesion was previously treated with a stent (unknown type). 4. LM lesion, 40% stenosed. 5. Mid RCA to Dist RCA lesion, 100% stenosed. 6. Prox RCA lesion, 85% stenosed. 7. Post Atrio lesion, 50% stenosed. 8. There is mild left ventricular systolic dysfunction.  Acute inferior ST elevation myocardial infarction due to occlusion of the mid right coronary beyond a previously placed mid vessel drug-eluting stent. There is also high-grade obstruction in the proximal RCA, proximal to the previously placed stent. Left-to-right collaterals in the distal right coronary bed were noted with left coronary injections.  Successful angioplasty and stenting of both the RCA mid vessel total occlusion and the proximal 85% stenosis reducing both areas to 0% with TIMI grade 3 flow using using a 3.5 x 16 Promus Premier (postdilated to 3.75 mm) in the mid lesion and a 4.0 x 16 Promus Premier in the proximal lesion.  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  Widely patent left anterior descending.  40% distal left main stenosis.  50% mid ramus intermedius stenosis.  Severe inferolateral basal and mid inferior wall hypokinesis/akinesis. Ejection fraction 45-50%. Significant elevation LVEDP of 30 mmHg. RECOMMENDATIONS:  Aspirin and Brilinta 12 months  High intensity statin therapy.  Discharge in 48 hours may be appropriate  Primary cardiologist is Dr. Nanetta Batty  Consider beta blocker and ACE inhibitor therapy as tolerated by blood pressure, heart rate, and kidney function. At this time beta blocker therapy is being held due to significant bradycardia that developed after reperfusion. ACE inhibitor therapy a be useful if kidney function allows. _____________   History of Present Illness     75 yo male pt of Dr Allyson Sabal w/ DES RCA & CFX 2006, non-ischemic MV 2011. Not seen since recently since he moved to [facility], [place]. Visiting family here, had chest pain>>SL NTG no help>>ER. Pt with ongoing pain and was taken to the cath lab.    Hospital Course     Consultants: none   Cardiac cath results are above. He had CTO CFX 2nd ISR. His RCA was stented with DES x 2.   He was already on ASA, high-dose statin, and Bystolic. Plavix was changed to Brilinta and he is to remain on DAPT for life. Cardiac rehab was ordered. There was concern for CHF, his BNP was slightly elevated but his CXR was fine. No Lasix was needed.  His BP was initially low and he was bradycardic. However, his heart rate improved and the Bystolic was restarted. His BP is not currently high enough to add  an ACE/ARB, but perhaps this could be done as an outpatient.  On 02/27, he was seen by Dr Katrinka Blazing and all data were reviewed. He did well with ambulation, and is considered stable for discharge, to have early followup with cardiology (in [place] per pt preference).  He will need LFTs and lipid profile scheduled as an  outpatient.  _____________  Discharge Vitals Blood pressure 106/62, pulse 65, temperature 98.2 F (36.8 C), temperature source Oral, resp. rate 21, height 6' (1.829 m), weight 239 lb 1.6 oz (108.455 kg), SpO2 100 %.  Filed Weights   01/20/16 1943 01/21/16 1903 01/22/16 0500  Weight: 234 lb 12.6 oz (106.5 kg) 234 lb (106.142 kg) 239 lb 1.6 oz (108.455 kg)    Labs & Radiologic Studies     CBC  Recent Labs  01/20/16 1932 01/21/16 0212  WBC 8.3 8.3  HGB 14.5 14.1  HCT 42.2 41.3  MCV 89.6 90.4  PLT 180 169   Basic Metabolic Panel  Recent Labs  01/20/16 1725 01/20/16 1735 01/20/16 1932 01/21/16 0212  NA 142 141  --  143  K 4.1 4.1  --  3.7  CL 104 103  --  108  CO2 26  --   --  26  GLUCOSE 108* 109*  --  118*  BUN 16 19  --  10  CREATININE 0.93 1.00 0.96 0.88  CALCIUM 9.1  --   --  8.5*   Cardiac Enzymes  Recent Labs  01/20/16 1932 01/21/16 0212 01/21/16 0724  TROPONINI 14.91* 81.90* 43.10*   B NATRIURETIC PEPTIDE  Date Value Ref Range Status  01/21/2016 163.8* 0.0 - 100.0 pg/mL Final   Dg Chest 2 View 01/22/2016  CLINICAL DATA:  Coronary artery disease with stent placement, recent STEMI EXAM: CHEST  2 VIEW COMPARISON:  Portable chest x-ray of January 20, 2016 FINDINGS: The lungs are better inflated today. There is no infiltrate or atelectasis. The heart is normal in size. The pulmonary vascularity is not engorged. The mediastinum is normal in width. There is no pleural effusion. 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 IMPRESSION: There is no evidence of CHF, pneumonia, nor other acute cardiopulmonary abnormality. Electronically Signed   By: David  [place] M.D.   On: 01/22/2016 08:22   Dg Chest Port 1 View 01/20/2016  CLINICAL DATA:  Mid sternal chest pressure, vomiting EXAM: PORTABLE CHEST 1 VIEW COMPARISON:  05/09/2013 FINDINGS: Bibasilar opacities, likely atelectasis. Heart is normal size. Low lung volumes. No effusions or acute bony  abnormality. IMPRESSION: Low lung volumes, bibasilar atelectasis. Electronically Signed   By: Charlett Nose M.D.   On: 01/20/2016 18:11    Disposition   Pt is being discharged home today in good condition.  Follow-up Plans & Appointments    Follow-up Information    Follow up with Norma Fredrickson, NP On 01/29/2016.   Specialties:  Nurse Practitioner, Interventional Cardiology, Cardiology, Radiology   Why:  See Provider at 9:30 am, please arrive 15 minutes early for paperwork.   Contact information:   [address] [place] [address] [phone]       Follow up with Nanetta Batty, MD.   Specialties:  Cardiology, Radiology   Why:  As scheduled   Contact information:   [address] [place] [address] [phone]      Discharge Instructions    Amb Referral to Cardiac Rehabilitation    Complete by:  As directed   To Rada Hay,  [place]  Diagnosis:   Myocardial Infarction PCI       Diet - low sodium heart healthy    Complete by:  As directed      Increase activity slowly    Complete by:  As directed            Discharge Medications   Current Discharge Medication List    START taking these medications   Details  ticagrelor (BRILINTA) 90 MG TABS tablet Take 1 tablet (90 mg total) by mouth 2 (two) times daily. Qty: 60 tablet, Refills: 11      CONTINUE these medications which have CHANGED   Details  nitroGLYCERIN (NITROLINGUAL) 0.4 MG/SPRAY spray Place 1 spray under the tongue every 5 (five) minutes x 3 doses as needed for chest pain. Qty: 12 g, Refills: 12   Associated Diagnoses: CAD in native artery      CONTINUE these medications which have NOT CHANGED   Details  aspirin EC 81 MG tablet Take 81 mg by mouth at bedtime.    atorvastatin (LIPITOR) 80 MG tablet Take 80 mg by mouth at bedtime.    diphenhydrAMINE (BENADRYL) 25 MG tablet Take 25 mg by mouth daily as needed for allergies.    Esomeprazole Magnesium (NEXIUM PO) Take  1 capsule by mouth daily as needed (acid reflux).    folic acid (FOLVITE) 1 MG tablet Take 1 mg by mouth See admin instructions. Take 1 tablet (1 mg) by mouth daily at bedtime - except Thursdays    methotrexate (RHEUMATREX) 2.5 MG tablet Take 10 mg by mouth once a week. Thursdays    nebivolol (BYSTOLIC) 5 MG tablet Take 5 mg by mouth daily.         Aspirin prescribed at discharge?  Yes High Intensity Statin Prescribed? (Lipitor 40-80mg  or Crestor 20-40mg ): Yes Beta Blocker Prescribed? Yes For EF 45% or less, Was ACEI/ARB Prescribed? No: BP TOO LOW ADP Receptor Inhibitor Prescribed? (i.e. Plavix etc.-Includes Medically Managed Patients): Yes For EF <40%, Aldosterone Inhibitor Prescribed? No: BP TOO LOW Was EF assessed during THIS hospitalization? Yes Was Cardiac Rehab II ordered? (Included Medically managed Patients): Yes   Outstanding Labs/Studies   none  Duration of Discharge Encounter   Greater than 30 minutes including physician time.  Melida Quitter NP 01/22/2016, 11:48 AM

## 2016-01-22 NOTE — Telephone Encounter (Signed)
TCM per RHonda  3/6 @#930 w/ Lawson Fiscal @ [address]

## 2016-01-22 NOTE — Telephone Encounter (Signed)
PATIENT IS  STILL ADMITTED TO HOSPITAL 01/22/16

## 2016-01-23 NOTE — Telephone Encounter (Signed)
LMTCB

## 2016-01-24 ENCOUNTER — Telehealth: Payer: Self-pay | Admitting: Nurse Practitioner

## 2016-01-24 ENCOUNTER — Telehealth: Payer: Self-pay | Admitting: Cardiovascular Disease

## 2016-01-24 NOTE — Telephone Encounter (Signed)
New Message  Pt called states that he would like to a call back to discuss if the TCM appt is needed being that he lives in [place] and if there is something that can be done in the city that he currently resides. Please call back to discuss

## 2016-01-24 NOTE — Telephone Encounter (Signed)
I returned call to patient. He was in [place] visiting when he had his event and hospitalization. He states he has a Personal assistant in [place]. Prefers to follow up there.  TCM appt cancelled. Discussed new meds and regimen w/ patient. Offered to send paperwork to his cardiologist office on request.  Pt adds that he may be switching providers locally so he will call if paperwork needs to be sent and notify about destination. No further needs.

## 2016-01-24 NOTE — Telephone Encounter (Signed)
lmtcb

## 2016-01-29 ENCOUNTER — Encounter: Payer: PRIVATE HEALTH INSURANCE | Admitting: Nurse Practitioner

## 2016-02-17 ENCOUNTER — Other Ambulatory Visit: Payer: Self-pay | Admitting: Physician Assistant

## 2016-05-30 ENCOUNTER — Telehealth: Payer: Self-pay | Admitting: Cardiovascular Disease

## 2016-05-30 NOTE — Telephone Encounter (Signed)
Called pt to confirm his appt. Previous telephone note says that [place] and wanted the tcm appt cancelled because he was going to follow up [place] cardiologist [place]. LMTCB

## 2016-06-19 ENCOUNTER — Encounter: Payer: PRIVATE HEALTH INSURANCE | Admitting: Nurse Practitioner

## 2016-06-23 IMAGING — CR DG CHEST 2V
2 series · 2 of 2 positions shown · non-contrast
Comparison: Portable chest x-ray January 20, 2016

CLINICAL DATA: Coronary artery disease with stent placement, recent
STEMI

EXAM:
CHEST  2 VIEW

[chest pa]
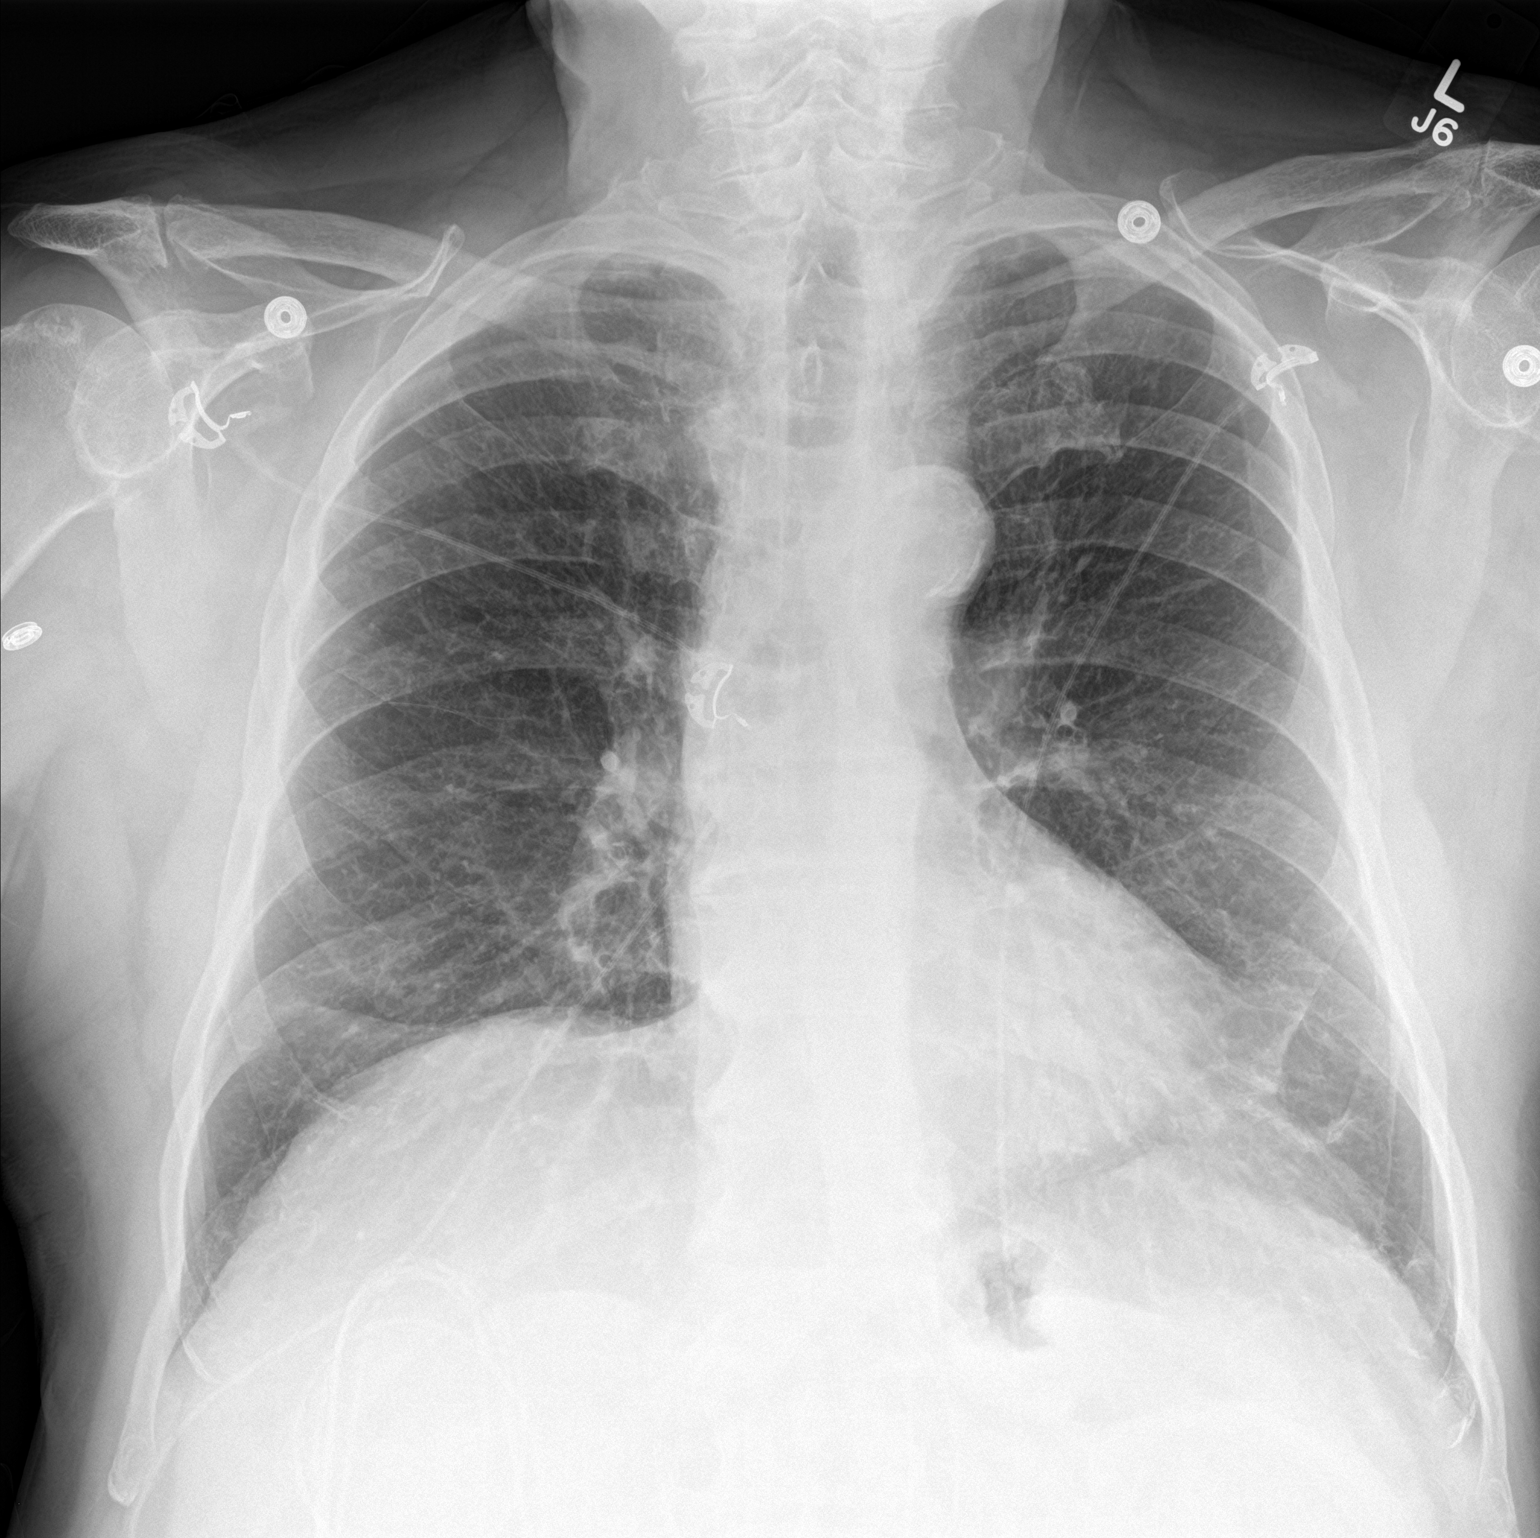

[chest lat]
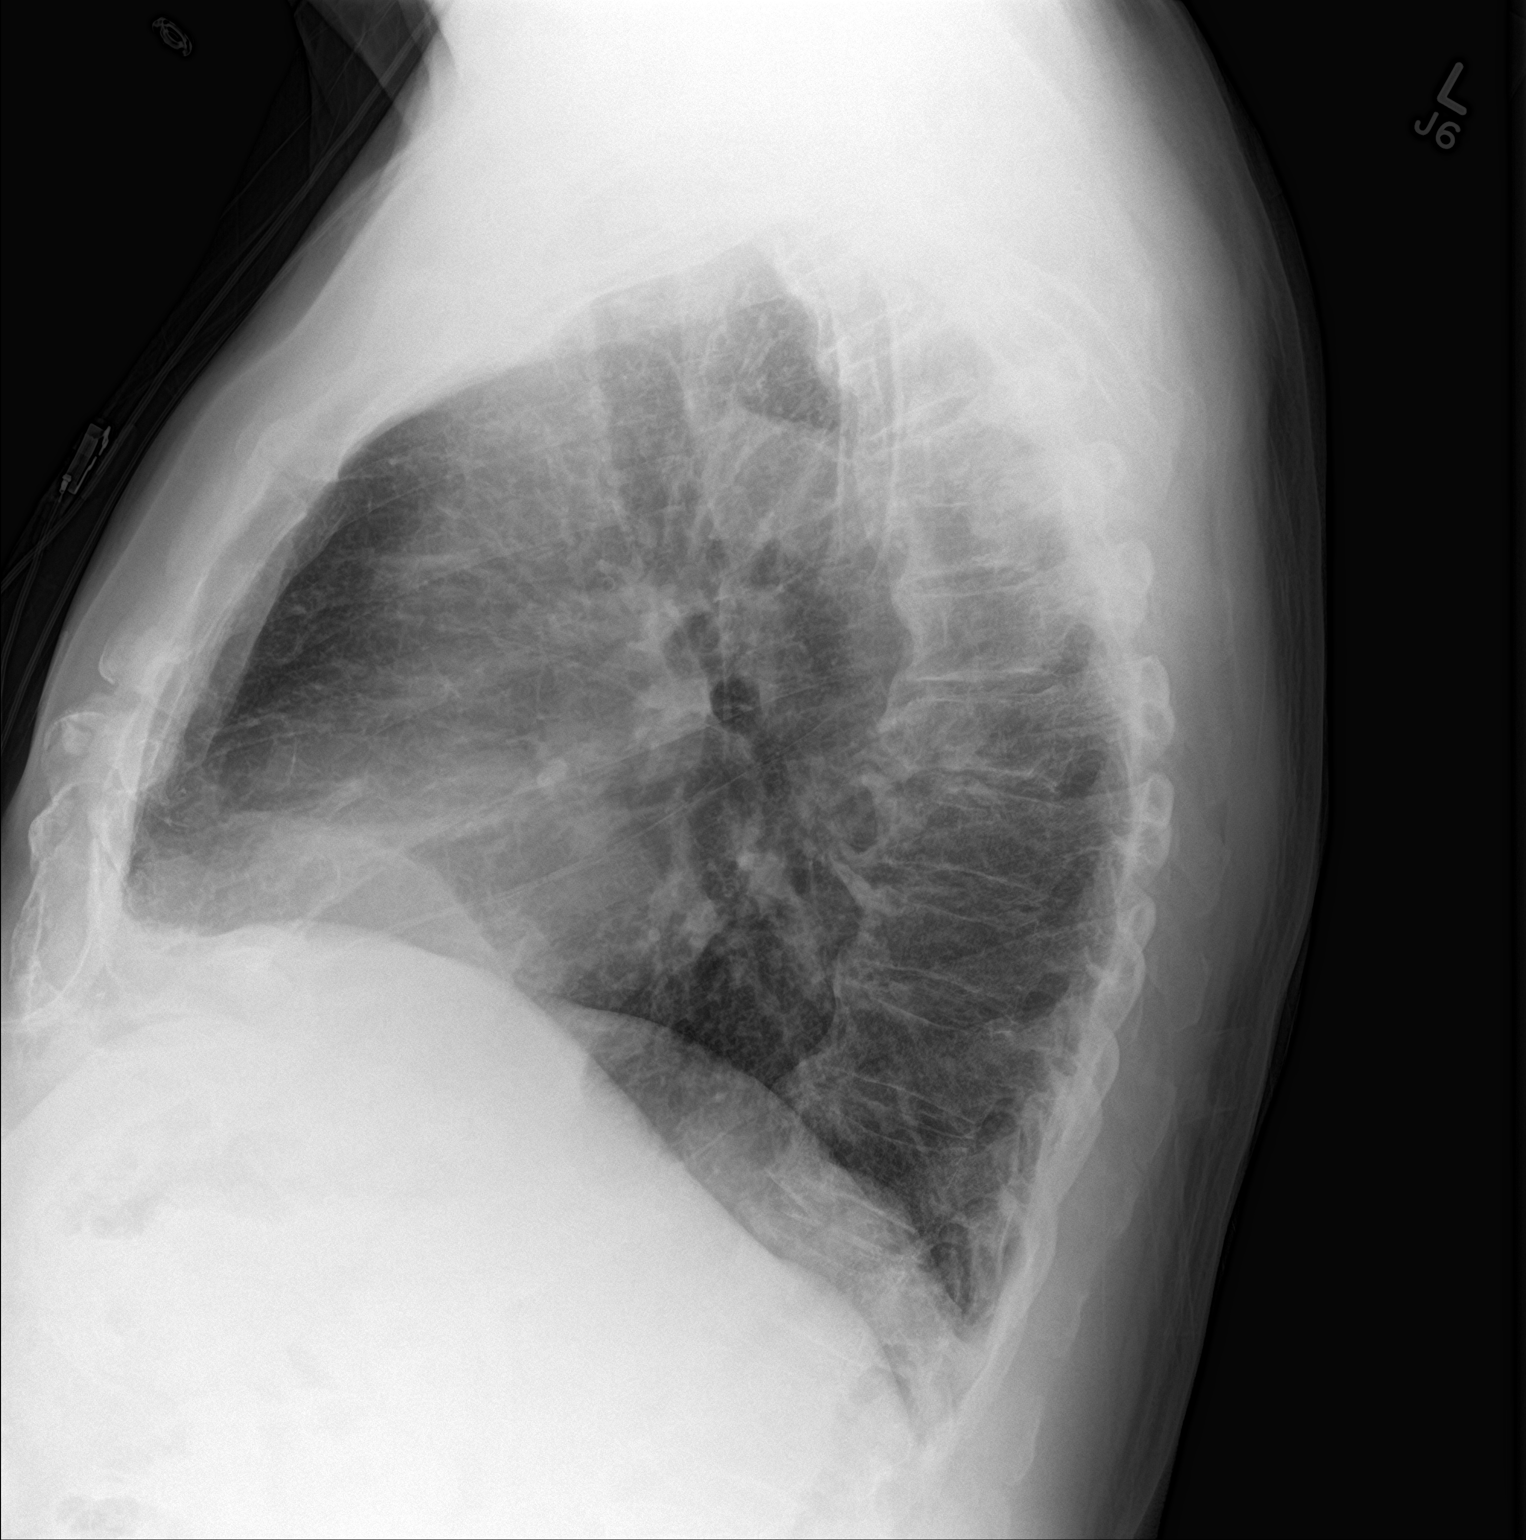

[2 of 2 positions shown; findings below may reference images not displayed]

FINDINGS: The lungs are better inflated today. There is no infiltrate or
atelectasis. The heart is normal in size. The pulmonary vascularity
is not engorged. The mediastinum is normal in width. There is no
pleural effusion. There is degenerative disc disease of the mid
thoracic spine.
IMPRESSION: There is no evidence of CHF, pneumonia, nor other acute
cardiopulmonary abnormality.

## 2017-03-19 ENCOUNTER — Other Ambulatory Visit: Payer: Self-pay | Admitting: Cardiovascular Disease

## 2022-09-06 ENCOUNTER — Telehealth: Payer: Self-pay | Admitting: Cardiovascular Disease

## 2022-09-06 NOTE — Telephone Encounter (Signed)
Returned call to patient to let him know that we did receive the cath DVD last week, but just got the hospital report in the mail this morning. Patient states that surgeon post-cath told him that he needed to have open heart surgery d/t blockages he still had. He states that later when he was in a room (describes similar to PACU) unit, that another surgeon told him he would NOT need further surgery and he would be managed with medication. Patient says he was told to begin Aspirin 81mg  daily and Amlodipine 5mg  daily. Patient states that he was referred to Dr Burt Knack by a family friend. He is still having occasional arm pain and states "I just feel like crap." Patient's appointment was originally scheduled for 09/20/22, but has now been moved up to 09/09/22. Patient appreciates call and appt accomodation.

## 2022-09-06 NOTE — Telephone Encounter (Signed)
Pt states results and release documents should have been received last Friday 10/06 and he says he would like a call back to discuss results. Please advise.

## 2022-09-09 ENCOUNTER — Ambulatory Visit: Payer: PRIVATE HEALTH INSURANCE | Attending: Cardiovascular Disease | Admitting: Cardiovascular Disease

## 2022-09-09 ENCOUNTER — Encounter: Payer: Self-pay | Admitting: Cardiovascular Disease

## 2022-09-09 VITALS — BP 120/80 | HR 80 | Ht 72.0 in | Wt 222.4 lb

## 2022-09-09 DIAGNOSIS — I25118 Atherosclerotic heart disease of native coronary artery with other forms of angina pectoris: Secondary | ICD-10-CM | POA: Diagnosis not present

## 2022-09-09 DIAGNOSIS — Z01810 Encounter for preprocedural cardiovascular examination: Secondary | ICD-10-CM | POA: Diagnosis not present

## 2022-09-09 DIAGNOSIS — I2510 Atherosclerotic heart disease of native coronary artery without angina pectoris: Secondary | ICD-10-CM

## 2022-09-09 LAB — CBC
Hematocrit: 46 % (ref 37.5–51.0)
Hemoglobin: 17 g/dL (ref 13.0–17.7)
MCH: 34.5 pg — ABNORMAL HIGH (ref 26.6–33.0)
MCHC: 37 g/dL — ABNORMAL HIGH (ref 31.5–35.7)
MCV: 93 fL (ref 79–97)
Platelets: 236 10*3/uL (ref 150–450)
RBC: 4.93 x10E6/uL (ref 4.14–5.80)
RDW: 14.5 % (ref 11.6–15.4)
WBC: 9.7 10*3/uL (ref 3.4–10.8)

## 2022-09-09 LAB — BASIC METABOLIC PANEL
BUN/Creatinine Ratio: 20 (ref 10–24)
BUN: 20 mg/dL (ref 8–27)
CO2: 28 mmol/L (ref 20–29)
Calcium: 9.6 mg/dL (ref 8.6–10.2)
Chloride: 103 mmol/L (ref 96–106)
Creatinine, Ser: 1.01 mg/dL (ref 0.76–1.27)
Glucose: 104 mg/dL — ABNORMAL HIGH (ref 70–99)
Potassium: 4.6 mmol/L (ref 3.5–5.2)
Sodium: 140 mmol/L (ref 134–144)
eGFR: 76 mL/min/{1.73_m2} (ref >59)

## 2022-09-09 NOTE — Progress Notes (Signed)
Cardiology Office Note:    Date:  09/13/2022   ID:  [name], DOB 1941/03/25, MRN [id number]  PCP:  Laurance Flatten, MD   [facility] Providers Cardiologist:  Tonny Bollman, MD     Referring MD: No ref. provider found   Chief Complaint  Patient presents with   Coronary Artery Disease    History of Present Illness:    Nicholas Reilly is a 81 y.o. male presenting for evaluation of coronary artery disease.  The patient initially presented in 2006 at age 69 with unstable angina.  He was noted to have moderate stenosis in the mid circumflex and right coronary arteries at that time.  He underwent PCI with Cypher drug-eluting stents in both vessels.  He then had an inferior wall STEMI in 2017.  He was found to have total occlusion of the mid RCA with high-grade obstruction in the proximal RCA.  Both lesions were treated with PCI with a 3.5 x 16 mm Promus Premier DES in the mid vessel and a 4.0 x 16 mm Promus Premier DES in the proximal vessel.  The circumflex was noted to be chronically occluded at that time with mild to moderate distal left main stenosis, moderate stenosis of the ramus intermedius, and patency of the LAD.  The [place] and he was planning to have right inguinal hernia surgery.  He underwent a nuclear stress test showing inferolateral scar with mild to moderate peri-infarct ischemia extending into the lateral region.  He was also noted to have recurrent chest discomfort.  The patient was referred for cardiac catheterization with findings of severe stenosis in the distal RCA and question of total occlusion in that region.  The left main stent was noted to have moderate stenosis.  The patient was told he might need CABG but also there was a recommendation for medical therapy.  He presents today for a second opinion regarding his care.  He is here with his son and daughter-in-law.  The patient reports  progressive fatigue and dyspnea with intermittent chest discomfort experiencing a pressure-like sensation in the left chest.  He is short of breath with physical activity.  Symptoms have been progressive with primary limitation being related fatigue and exercise intolerance.  He has no orthopnea, PND, edema, or heart palpitations.  Past Medical History:  Diagnosis Date   Coronary artery disease 2006   DES RCA & CFX   CORONARY ARTERY DISEASE 11/29/2008   Qualifier: Diagnosis of  By: Ninetta Lights MD, Tinnie Gens     Esophageal abnormality    GERD (gastroesophageal reflux disease)    Hypercholesteremia    Hypertension    Non-ST elevation MI (NSTEMI) (HCC) 01/20/2016   DES x 2 RCA    Past Surgical History:  Procedure Laterality Date   CARDIAC CATHETERIZATION N/A 01/20/2016   Procedure: Left Heart Cath and Coronary Angiography;  Surgeon: Lyn Records, MD; LM 40%, LAD OK, RI 50%, CFX ISR CTO, pRCA 85%, mRCA 100%, EF 45-50%    CARDIAC CATHETERIZATION N/A 01/20/2016   Procedure: Coronary Stent Intervention;  Surgeon: Lyn Records, MD;  3.5 x 16 Promus Premier DES mRCA 100%>>0%, 4.0 x 16 Promus Premier DES pRCA 85%>>0%   CORONARY ANGIOPLASTY     CORONARY STENT PLACEMENT     ESOPHAGOGASTRODUODENOSCOPY (EGD) WITH ESOPHAGEAL DILATION N/A 05/09/2013   Procedure: ESOPHAGOGASTRODUODENOSCOPY (EGD) WITH ESOPHAGEAL DILATION;  Surgeon: Theda Belfast, MD;  Location: [facility];  Service: Endoscopy;  Laterality: N/A;    Current Medications: Current Meds  Medication Sig   amLODipine (NORVASC) 5 MG tablet Take 5 mg by mouth daily.   aspirin EC 81 MG tablet Take 81 mg by mouth at bedtime.   clopidogrel (PLAVIX) 75 MG tablet Take 75 mg by mouth daily.   diphenhydrAMINE (BENADRYL) 25 MG tablet Take 25 mg by mouth daily as needed for allergies.   FLUoxetine (PROZAC) 20 MG capsule Take 20 mg by mouth daily as needed ("Sweet Pill").   losartan (COZAAR) 100 MG tablet Take 100 mg by mouth daily.   nitroGLYCERIN  (NITROSTAT) 0.4 MG SL tablet Place 0.4 mg under the tongue every 5 (five) minutes as needed for chest pain.   pantoprazole (PROTONIX) 40 MG tablet Take 40 mg by mouth daily.   Polyethyl Glycol-Propyl Glycol (SYSTANE) 0.4-0.3 % SOLN Place 1 drop into both eyes as needed (Dry eyes).   [DISCONTINUED] atorvastatin (LIPITOR) 80 MG tablet Take 80 mg by mouth at bedtime.   [DISCONTINUED] diclofenac Sodium (VOLTAREN) 1 % GEL Apply topically.   [DISCONTINUED] esomeprazole (NEXIUM) 20 MG capsule Take 20 mg by mouth daily as needed (acid reflux).   [DISCONTINUED] folic acid (FOLVITE) 1 MG tablet Take 1 mg by mouth See admin instructions. Take 1 tablet (1 mg) by mouth daily at bedtime - except Thursdays   [DISCONTINUED] methotrexate (RHEUMATREX) 2.5 MG tablet Take 10 mg by mouth once a week. Thursdays   [DISCONTINUED] nebivolol (BYSTOLIC) 5 MG tablet Take 5 mg by mouth daily.     Allergies:   Doxycycline and Other   Social History   Socioeconomic History   Marital status: Widowed    Spouse name: Not on file   Number of children: Not on file   Years of education: Not on file   Highest education level: Not on file  Occupational History   Not on file  Tobacco Use   Smoking status: Former    Types: Cigarettes    Quit date: 11/13/1983    Years since quitting: 38.8   Smokeless tobacco: Not on file  Substance and Sexual Activity   Alcohol use: Yes    Alcohol/week: 4.0 standard drinks of alcohol    Types: 4 drink(s) per week   Drug use: No   Sexual activity: Not on file  Other Topics Concern   Not on file  Social History Narrative   Not on file   Social Determinants of Health   Financial Resource Strain: Not on file  Food Insecurity: Not on file  Transportation Needs: Not on file  Physical Activity: Not on file  Stress: Not on file  Social Connections: Not on file     Family History: The patient's family history is negative for Heart disease.  ROS:   Please see the history of  present illness.     All other systems reviewed and are negative.  EKGs/Labs/Other Studies Reviewed:    The following studies were reviewed today: Echo 08-26-22:  Left Ventricle: Size is normal. Mildly increased wall thickness.  Suboptimal imaging, unable to exclude regional wall motion abnormalities.  Low normal systolic function with an approximate EF of 50 - 55%. Grade I:  There is impaired left ventricular relaxation.    Aortic Valve: Mild stenosis. AV mean gradient is 12.0 mmHg. AV peak  gradient is 21.0 mmHg. AV area by continuity VTI is 1.6 cm2. AV stenosis  dimensionless index is 0.5 (reference range, severe: <0.25).    Mitral Valve: Mild mitral annular calcification.      A complete 2D, color flow doppler and spectral doppler echocardiogram  was performed.   Cardiac Cath 08-26-22: Subtotally occluded distal RCA.  Chronic total occlusion of the proximal Lcx.  Obstructive mid RI disease.  Moderate distal LM disease.  Minimal, nonobstructive LAD disease.   Recommendations:   Initial recommendation is optimization of medical therapy.  If the patient  is felt to be symptomatic refractory to medical therapy, complex  multivessel PCI may be entertained.    EKG:  EKG is performed ordered today.  The ekg ordered today demonstrates NSR with baseline artifact, age-indeterminate inferior MI  Recent Labs: 09/09/2022: BUN 20; Creatinine, Ser 1.01; Hemoglobin 17.0; Platelets 236; Potassium 4.6; Sodium 140  Recent Lipid Panel    Component Value Date/Time   CHOL 143 10/28/2013 0922   TRIG 111 10/28/2013 0922   HDL 31 (L) 10/28/2013 0922   CHOLHDL 4.6 10/28/2013 0922   VLDL 22 10/28/2013 0922   LDLCALC 90 10/28/2013 0922     Risk Assessment/Calculations:            Physical Exam:    VS:  BP 120/80   Pulse 80   Ht 6' (1.829 m)   Wt 222 lb 6.4 oz (100.9 kg)   SpO2 95%   BMI 30.16 kg/m     Wt Readings from Last 3 Encounters:  09/09/22 222 lb 6.4 oz (100.9 kg)   01/22/16 239 lb 1.6 oz (108.5 kg)  11/12/13 242 lb 1.6 oz (109.8 kg)     GEN:  Well nourished, well developed in no acute distress HEENT: Normal NECK: No JVD; No carotid bruits LYMPHATICS: No lymphadenopathy CARDIAC: RRR, no murmurs, rubs, gallops RESPIRATORY:  Clear to auscultation without rales, wheezing or rhonchi  ABDOMEN: Soft, non-tender, non-distended MUSCULOSKELETAL:  No edema; No deformity  SKIN: Warm and dry NEUROLOGIC:  Alert and oriented x 3 PSYCHIATRIC:  Normal affect   ASSESSMENT:    1. Pre-procedural cardiovascular examination   2. Coronary artery disease involving native coronary artery of native heart with other form of angina pectoris ([place])    PLAN:    In order of problems listed above:  The patient presents with progressive symptoms of fatigue, chest discomfort, and exercise intolerance.  He has known coronary artery disease with past history of multivessel stenting and prior inferior wall MI in 2017.  He recently underwent stress testing showing a gated LVEF of 46%, moderate to severe inferolateral scar with mild to moderate peri-infarct ischemia extending into the lateral wall.  He underwent diagnostic catheterization which I am able to review today.  The films demonstrate moderate distal left main stenosis, chronic occlusion of the circumflex, moderate stenosis of the ramus intermedius, patency of the LAD, and severe stenosis of the distal RCA.  The procedure was complicated by difficulty manipulating catheters from the right radial position.  The RCA was never selectively engaged and it was poorly visualized.  I think that PCI of the distal RCA is a potential option for the patient as this is where his perfusion defect is on nuclear scan and I suspect this is also contributing significantly to his symptoms as his other coronary anatomy is relatively stable compared to the 2017 cath study in our system.  I discussed potential treatment options with the patient and his  family today.  He is on appropriate medical therapy with DAPT, and ARB, amlodipine for antianginal therapy.  He has been on atorvastatin 80 mg daily.  I think it is appropriate to perform repeat cardiac catheterization  from femoral access with plans for PCI if technically feasible.  He may require pressure wire analysis of the left main but will determine this at the time of his repeat cath study. I have reviewed the risks, indications, and alternatives to cardiac catheterization, possible angioplasty, and stenting with the patient. Risks include but are not limited to bleeding, infection, vascular injury, stroke, myocardial infection, arrhythmia, kidney injury, radiation-related injury in the case of prolonged fluoroscopy use, emergency cardiac surgery, and death. The patient understands the risks of serious complication is 1-2 in 1000 with diagnostic cardiac cath and 1-2% or less with angioplasty/stenting.        Shared Decision Making/Informed Consent The risks [stroke (1 in 1000), death (1 in 1000), kidney failure [usually temporary] (1 in 500), bleeding (1 in 200), allergic reaction [possibly serious] (1 in 200)], benefits (diagnostic support and management of coronary artery disease) and alternatives of a cardiac catheterization were discussed in detail with Mr. Kersh and he is willing to proceed.    Medication Adjustments/Labs and Tests Ordered: Current medicines are reviewed at length with the patient today.  Concerns regarding medicines are outlined above.  Orders Placed This Encounter  Procedures   CBC   Basic metabolic panel   EKG 12-Lead   No orders of the defined types were placed in this encounter.   Patient Instructions  Medication Instructions:  Your physician recommends that you continue on your current medications as directed. Please refer to the Current Medication list given to you today.  *If you need a refill on your cardiac medications before your next appointment,  please call your pharmacy*   Lab Work: CBC, BMET Today If you have labs (blood work) drawn today and your tests are completely normal, you will receive your results only by: MyChart Message (if you have MyChart) OR A paper copy in the mail If you have any lab test that is abnormal or we need to change your treatment, we will call you to review the results.   Testing/Procedures: Cardiac Catheterization w/ PCI Your physician has requested that you have a cardiac catheterization. Cardiac catheterization is used to diagnose and/or treat various heart conditions. Doctors may recommend this procedure for a number of different reasons. The most common reason is to evaluate chest pain. Chest pain can be a symptom of coronary artery disease (CAD), and cardiac catheterization can show whether plaque is narrowing or blocking your heart's arteries. This procedure is also used to evaluate the valves, as well as measure the blood flow and oxygen levels in different parts of your heart. For further information please visit [email or web address] Please follow instruction sheet, as given.  Follow-Up: At [facility], you and your health needs are our priority.  As part of our continuing mission to provide you with exceptional heart care, we have created designated Provider Care Teams.  These Care Teams include your primary Cardiologist (physician) and Advanced Practice Providers (APPs -  Physician Assistants and Nurse Practitioners) who all work together to provide you with the care you need, when you need it.  Your next appointment:   As needed  The format for your next appointment:   In Person  Provider:   Tonny Bollman MD  Other Instructions       Cardiac/Peripheral Catheterization   You are scheduled for a Cardiac Catheterization on Friday, October 20 with Dr. Tonny Bollman.  1. Please arrive at the Main Entrance A at [facility]: [address] [place], [place] [address] on  October 20 at 7:00 AM (This time is two hours before your procedure to ensure your preparation). Free valet parking service is available. You will check in at ADMITTING. The support person will be asked to wait in the waiting room.  It is OK to have someone drop you off and come back when you are ready to be discharged.        Special note: Every effort is made to have your procedure done on time. Please understand that emergencies sometimes delay scheduled procedures.   . 2. Diet: Do not eat solid foods after midnight.  You may have clear liquids until 5 AM the day of the procedure.  3. Labs: TODAY  4. Medication instructions in preparation for your procedure:   Contrast Allergy: No  On the morning of your procedure, take Aspirin 81 mg, Clopidogrel 75mg  and any morning medicines NOT listed above.  You may use sips of water.  5. Plan to go home the same day, you will only stay overnight if medically necessary. 6. You MUST have a responsible adult to drive you home. 7. An adult MUST be with you the first 24 hours after you arrive home. 8. Bring a current list of your medications, and the last time and date medication taken. 9. Bring ID and current insurance cards. 10.Please wear clothes that are easy to get on and off and wear slip-on shoes.  Thank you for allowing to care for you!   -- [facility]   Important Information About Sugar         Signed, [place], MD  09/13/2022 6:40 AM    [facility]

## 2022-09-09 NOTE — H&P (View-Only) (Signed)
Cardiology Office Note:    Date:  09/13/2022   ID:  Nicholas Reilly, DOB 1941/03/25, MRN [id number]  PCP:  Laurance Flatten, MD   [facility] Providers Cardiologist:  Tonny Bollman, MD     Referring MD: No ref. provider found   Chief Complaint  Patient presents with   Coronary Artery Disease    History of Present Illness:    Nicholas Reilly is a 81 y.o. male presenting for evaluation of coronary artery disease.  The patient initially presented in 2006 at age 69 with unstable angina.  He was noted to have moderate stenosis in the mid circumflex and right coronary arteries at that time.  He underwent PCI with Cypher drug-eluting stents in both vessels.  He then had an inferior wall STEMI in 2017.  He was found to have total occlusion of the mid RCA with high-grade obstruction in the proximal RCA.  Both lesions were treated with PCI with a 3.5 x 16 mm Promus Premier DES in the mid vessel and a 4.0 x 16 mm Promus Premier DES in the proximal vessel.  The circumflex was noted to be chronically occluded at that time with mild to moderate distal left main stenosis, moderate stenosis of the ramus intermedius, and patency of the LAD.  The [place] and he was planning to have right inguinal hernia surgery.  He underwent a nuclear stress test showing inferolateral scar with mild to moderate peri-infarct ischemia extending into the lateral region.  He was also noted to have recurrent chest discomfort.  The patient was referred for cardiac catheterization with findings of severe stenosis in the distal RCA and question of total occlusion in that region.  The left main stent was noted to have moderate stenosis.  The patient was told he might need CABG but also there was a recommendation for medical therapy.  He presents today for a second opinion regarding his care.  He is here with his son and daughter-in-law.  The patient reports  progressive fatigue and dyspnea with intermittent chest discomfort experiencing a pressure-like sensation in the left chest.  He is short of breath with physical activity.  Symptoms have been progressive with primary limitation being related fatigue and exercise intolerance.  He has no orthopnea, PND, edema, or heart palpitations.  Past Medical History:  Diagnosis Date   Coronary artery disease 2006   DES RCA & CFX   CORONARY ARTERY DISEASE 11/29/2008   Qualifier: Diagnosis of  By: Ninetta Lights MD, Tinnie Gens     Esophageal abnormality    GERD (gastroesophageal reflux disease)    Hypercholesteremia    Hypertension    Non-ST elevation MI (NSTEMI) (HCC) 01/20/2016   DES x 2 RCA    Past Surgical History:  Procedure Laterality Date   CARDIAC CATHETERIZATION N/A 01/20/2016   Procedure: Left Heart Cath and Coronary Angiography;  Surgeon: Lyn Records, MD; LM 40%, LAD OK, RI 50%, CFX ISR CTO, pRCA 85%, mRCA 100%, EF 45-50%    CARDIAC CATHETERIZATION N/A 01/20/2016   Procedure: Coronary Stent Intervention;  Surgeon: Lyn Records, MD;  3.5 x 16 Promus Premier DES mRCA 100%>>0%, 4.0 x 16 Promus Premier DES pRCA 85%>>0%   CORONARY ANGIOPLASTY     CORONARY STENT PLACEMENT     ESOPHAGOGASTRODUODENOSCOPY (EGD) WITH ESOPHAGEAL DILATION N/A 05/09/2013   Procedure: ESOPHAGOGASTRODUODENOSCOPY (EGD) WITH ESOPHAGEAL DILATION;  Surgeon: Theda Belfast, MD;  Location: [facility];  Service: Endoscopy;  Laterality: N/A;    Current Medications: Current Meds  Medication Sig   amLODipine (NORVASC) 5 MG tablet Take 5 mg by mouth daily.   aspirin EC 81 MG tablet Take 81 mg by mouth at bedtime.   clopidogrel (PLAVIX) 75 MG tablet Take 75 mg by mouth daily.   diphenhydrAMINE (BENADRYL) 25 MG tablet Take 25 mg by mouth daily as needed for allergies.   FLUoxetine (PROZAC) 20 MG capsule Take 20 mg by mouth daily as needed ("Sweet Pill").   losartan (COZAAR) 100 MG tablet Take 100 mg by mouth daily.   nitroGLYCERIN  (NITROSTAT) 0.4 MG SL tablet Place 0.4 mg under the tongue every 5 (five) minutes as needed for chest pain.   pantoprazole (PROTONIX) 40 MG tablet Take 40 mg by mouth daily.   Polyethyl Glycol-Propyl Glycol (SYSTANE) 0.4-0.3 % SOLN Place 1 drop into both eyes as needed (Dry eyes).   [DISCONTINUED] atorvastatin (LIPITOR) 80 MG tablet Take 80 mg by mouth at bedtime.   [DISCONTINUED] diclofenac Sodium (VOLTAREN) 1 % GEL Apply topically.   [DISCONTINUED] esomeprazole (NEXIUM) 20 MG capsule Take 20 mg by mouth daily as needed (acid reflux).   [DISCONTINUED] folic acid (FOLVITE) 1 MG tablet Take 1 mg by mouth See admin instructions. Take 1 tablet (1 mg) by mouth daily at bedtime - except Thursdays   [DISCONTINUED] methotrexate (RHEUMATREX) 2.5 MG tablet Take 10 mg by mouth once a week. Thursdays   [DISCONTINUED] nebivolol (BYSTOLIC) 5 MG tablet Take 5 mg by mouth daily.     Allergies:   Doxycycline and Other   Social History   Socioeconomic History   Marital status: Widowed    Spouse name: Not on file   Number of children: Not on file   Years of education: Not on file   Highest education level: Not on file  Occupational History   Not on file  Tobacco Use   Smoking status: Former    Types: Cigarettes    Quit date: 11/13/1983    Years since quitting: 38.8   Smokeless tobacco: Not on file  Substance and Sexual Activity   Alcohol use: Yes    Alcohol/week: 4.0 standard drinks of alcohol    Types: 4 drink(s) per week   Drug use: No   Sexual activity: Not on file  Other Topics Concern   Not on file  Social History Narrative   Not on file   Social Determinants of Health   Financial Resource Strain: Not on file  Food Insecurity: Not on file  Transportation Needs: Not on file  Physical Activity: Not on file  Stress: Not on file  Social Connections: Not on file     Family History: The patient's family history is negative for Heart disease.  ROS:   Please see the history of  present illness.     All other systems reviewed and are negative.  EKGs/Labs/Other Studies Reviewed:    The following studies were reviewed today: Echo 08-26-22:  Left Ventricle: Size is normal. Mildly increased wall thickness.  Suboptimal imaging, unable to exclude regional wall motion abnormalities.  Low normal systolic function with an approximate EF of 50 - 55%. Grade I:  There is impaired left ventricular relaxation.    Aortic Valve: Mild stenosis. AV mean gradient is 12.0 mmHg. AV peak  gradient is 21.0 mmHg. AV area by continuity VTI is 1.6 cm2. AV stenosis  dimensionless index is 0.5 (reference range, severe: <0.25).    Mitral Valve: Mild mitral annular calcification.  A complete 2D, color flow doppler and spectral doppler echocardiogram  was performed.   Cardiac Cath 08-26-22: Subtotally occluded distal RCA.  Chronic total occlusion of the proximal Lcx.  Obstructive mid RI disease.  Moderate distal LM disease.  Minimal, nonobstructive LAD disease.   Recommendations:   Initial recommendation is optimization of medical therapy.  If the patient  is felt to be symptomatic refractory to medical therapy, complex  multivessel PCI may be entertained.    EKG:  EKG is performed ordered today.  The ekg ordered today demonstrates NSR with baseline artifact, age-indeterminate inferior MI  Recent Labs: 09/09/2022: BUN 20; Creatinine, Ser 1.01; Hemoglobin 17.0; Platelets 236; Potassium 4.6; Sodium 140  Recent Lipid Panel    Component Value Date/Time   CHOL 143 10/28/2013 0922   TRIG 111 10/28/2013 0922   HDL 31 (L) 10/28/2013 0922   CHOLHDL 4.6 10/28/2013 0922   VLDL 22 10/28/2013 0922   LDLCALC 90 10/28/2013 0922     Risk Assessment/Calculations:            Physical Exam:    VS:  BP 120/80   Pulse 80   Ht 6' (1.829 m)   Wt 222 lb 6.4 oz (100.9 kg)   SpO2 95%   BMI 30.16 kg/m     Wt Readings from Last 3 Encounters:  09/09/22 222 lb 6.4 oz (100.9 kg)   01/22/16 239 lb 1.6 oz (108.5 kg)  11/12/13 242 lb 1.6 oz (109.8 kg)     GEN:  Well nourished, well developed in no acute distress HEENT: Normal NECK: No JVD; No carotid bruits LYMPHATICS: No lymphadenopathy CARDIAC: RRR, no murmurs, rubs, gallops RESPIRATORY:  Clear to auscultation without rales, wheezing or rhonchi  ABDOMEN: Soft, non-tender, non-distended MUSCULOSKELETAL:  No edema; No deformity  SKIN: Warm and dry NEUROLOGIC:  Alert and oriented x 3 PSYCHIATRIC:  Normal affect   ASSESSMENT:    1. Pre-procedural cardiovascular examination   2. Coronary artery disease involving native coronary artery of native heart with other form of angina pectoris ([place])    PLAN:    In order of problems listed above:  The patient presents with progressive symptoms of fatigue, chest discomfort, and exercise intolerance.  He has known coronary artery disease with past history of multivessel stenting and prior inferior wall MI in 2017.  He recently underwent stress testing showing a gated LVEF of 46%, moderate to severe inferolateral scar with mild to moderate peri-infarct ischemia extending into the lateral wall.  He underwent diagnostic catheterization which I am able to review today.  The films demonstrate moderate distal left main stenosis, chronic occlusion of the circumflex, moderate stenosis of the ramus intermedius, patency of the LAD, and severe stenosis of the distal RCA.  The procedure was complicated by difficulty manipulating catheters from the right radial position.  The RCA was never selectively engaged and it was poorly visualized.  I think that PCI of the distal RCA is a potential option for the patient as this is where his perfusion defect is on nuclear scan and I suspect this is also contributing significantly to his symptoms as his other coronary anatomy is relatively stable compared to the 2017 cath study in our system.  I discussed potential treatment options with the patient and his  family today.  He is on appropriate medical therapy with DAPT, and ARB, amlodipine for antianginal therapy.  He has been on atorvastatin 80 mg daily.  I think it is appropriate to perform repeat cardiac catheterization  from femoral access with plans for PCI if technically feasible.  He may require pressure wire analysis of the left main but will determine this at the time of his repeat cath study. I have reviewed the risks, indications, and alternatives to cardiac catheterization, possible angioplasty, and stenting with the patient. Risks include but are not limited to bleeding, infection, vascular injury, stroke, myocardial infection, arrhythmia, kidney injury, radiation-related injury in the case of prolonged fluoroscopy use, emergency cardiac surgery, and death. The patient understands the risks of serious complication is 1-2 in 1000 with diagnostic cardiac cath and 1-2% or less with angioplasty/stenting.        Shared Decision Making/Informed Consent The risks [stroke (1 in 1000), death (1 in 1000), kidney failure [usually temporary] (1 in 500), bleeding (1 in 200), allergic reaction [possibly serious] (1 in 200)], benefits (diagnostic support and management of coronary artery disease) and alternatives of a cardiac catheterization were discussed in detail with Mr. Kersh and he is willing to proceed.    Medication Adjustments/Labs and Tests Ordered: Current medicines are reviewed at length with the patient today.  Concerns regarding medicines are outlined above.  Orders Placed This Encounter  Procedures   CBC   Basic metabolic panel   EKG 12-Lead   No orders of the defined types were placed in this encounter.   Patient Instructions  Medication Instructions:  Your physician recommends that you continue on your current medications as directed. Please refer to the Current Medication list given to you today.  *If you need a refill on your cardiac medications before your next appointment,  please call your pharmacy*   Lab Work: CBC, BMET Today If you have labs (blood work) drawn today and your tests are completely normal, you will receive your results only by: MyChart Message (if you have MyChart) OR A paper copy in the mail If you have any lab test that is abnormal or we need to change your treatment, we will call you to review the results.   Testing/Procedures: Cardiac Catheterization w/ PCI Your physician has requested that you have a cardiac catheterization. Cardiac catheterization is used to diagnose and/or treat various heart conditions. Doctors may recommend this procedure for a number of different reasons. The most common reason is to evaluate chest pain. Chest pain can be a symptom of coronary artery disease (CAD), and cardiac catheterization can show whether plaque is narrowing or blocking your heart's arteries. This procedure is also used to evaluate the valves, as well as measure the blood flow and oxygen levels in different parts of your heart. For further information please visit [email or web address] Please follow instruction sheet, as given.  Follow-Up: At [facility], you and your health needs are our priority.  As part of our continuing mission to provide you with exceptional heart care, we have created designated Provider Care Teams.  These Care Teams include your primary Cardiologist (physician) and Advanced Practice Providers (APPs -  Physician Assistants and Nurse Practitioners) who all work together to provide you with the care you need, when you need it.  Your next appointment:   As needed  The format for your next appointment:   In Person  Provider:   Tonny Bollman MD  Other Instructions       Cardiac/Peripheral Catheterization   You are scheduled for a Cardiac Catheterization on Friday, October 20 with Dr. Tonny Bollman.  1. Please arrive at the Main Entrance A at [facility]: [address] [place], [place] [address] on  October 20 at 7:00 AM (This time is two hours before your procedure to ensure your preparation). Free valet parking service is available. You will check in at ADMITTING. The support person will be asked to wait in the waiting room.  It is OK to have someone drop you off and come back when you are ready to be discharged.        Special note: Every effort is made to have your procedure done on time. Please understand that emergencies sometimes delay scheduled procedures.   . 2. Diet: Do not eat solid foods after midnight.  You may have clear liquids until 5 AM the day of the procedure.  3. Labs: TODAY  4. Medication instructions in preparation for your procedure:   Contrast Allergy: No  On the morning of your procedure, take Aspirin 81 mg, Clopidogrel 75mg  and any morning medicines NOT listed above.  You may use sips of water.  5. Plan to go home the same day, you will only stay overnight if medically necessary. 6. You MUST have a responsible adult to drive you home. 7. An adult MUST be with you the first 24 hours after you arrive home. 8. Bring a current list of your medications, and the last time and date medication taken. 9. Bring ID and current insurance cards. 10.Please wear clothes that are easy to get on and off and wear slip-on shoes.  Thank you for allowing to care for you!   -- [facility]   Important Information About Sugar         Signed, [place], MD  09/13/2022 6:40 AM    [facility]

## 2022-09-09 NOTE — Patient Instructions (Signed)
Medication Instructions:  Your physician recommends that you continue on your current medications as directed. Please refer to the Current Medication list given to you today.  *If you need a refill on your cardiac medications before your next appointment, please call your pharmacy*   Lab Work: CBC, BMET Today If you have labs (blood work) drawn today and your tests are completely normal, you will receive your results only by: MyChart Message (if you have MyChart) OR A paper copy in the mail If you have any lab test that is abnormal or we need to change your treatment, we will call you to review the results.   Testing/Procedures: Cardiac Catheterization w/ PCI Your physician has requested that you have a cardiac catheterization. Cardiac catheterization is used to diagnose and/or treat various heart conditions. Doctors may recommend this procedure for a number of different reasons. The most common reason is to evaluate chest pain. Chest pain can be a symptom of coronary artery disease (CAD), and cardiac catheterization can show whether plaque is narrowing or blocking your heart's arteries. This procedure is also used to evaluate the valves, as well as measure the blood flow and oxygen levels in different parts of your heart. For further information please visit [email or web address] Please follow instruction sheet, as given.  Follow-Up: At [facility] [place], you and your health needs are our priority.  As part of our continuing mission to provide you with exceptional heart care, we have created designated Provider Care Teams.  These Care Teams include your primary Cardiologist (physician) and Advanced Practice Providers (APPs -  Physician Assistants and Nurse Practitioners) who all work together to provide you with the care you need, when you need it.  Your next appointment:   As needed  The format for your next appointment:   In Person  Provider:   Tonny Bollman MD  Other  Instructions       Cardiac/Peripheral Catheterization   You are scheduled for a Cardiac Catheterization on Friday, October 20 with Dr. Tonny Bollman.  1. Please arrive at the Main Entrance A at [facility]: [address] [place], [place] [address] on October 20 at 7:00 AM (This time is two hours before your procedure to ensure your preparation). Free valet parking service is available. You will check in at ADMITTING. The support person will be asked to wait in the waiting room.  It is OK to have someone drop you off and come back when you are ready to be discharged.        Special note: Every effort is made to have your procedure done on time. Please understand that emergencies sometimes delay scheduled procedures.   . 2. Diet: Do not eat solid foods after midnight.  You may have clear liquids until 5 AM the day of the procedure.  3. Labs: TODAY  4. Medication instructions in preparation for your procedure:   Contrast Allergy: No  On the morning of your procedure, take Aspirin 81 mg, Clopidogrel 75mg  and any morning medicines NOT listed above.  You may use sips of water.  5. Plan to go home the same day, you will only stay overnight if medically necessary. 6. You MUST have a responsible adult to drive you home. 7. An adult MUST be with you the first 24 hours after you arrive home. 8. Bring a current list of your medications, and the last time and date medication taken. 9. Bring ID and current insurance cards. 10.Please wear clothes that are easy to  get on and off and wear slip-on shoes.  Thank you for allowing [place] to care for you!   --  Invasive Cardiovascular services   Important Information About Sugar

## 2022-09-10 ENCOUNTER — Telehealth: Payer: Self-pay | Admitting: *Deleted

## 2022-09-10 NOTE — Telephone Encounter (Signed)
Coronary Stent scheduled at [facility] for: Friday September 13, 2022 9 AM Arrival time and place: [facility] Main Entrance A at: 7 AM  Nothing to eat after midnight prior to procedure, clear liquids until 5 AM day of procedure.  Medication instructions: -Usual morning medications can be taken with sips of water including aspirin 81 mg and Plavix 75 mg  Confirmed patient has responsible adult to drive home post procedure and be with patient first 24 hours after arriving home.  Patient reports no new symptoms concerning for COVID-19 in the past 10 days.   Reviewed procedure instructions with patient.

## 2022-09-13 ENCOUNTER — Other Ambulatory Visit: Payer: Self-pay

## 2022-09-13 ENCOUNTER — Encounter: Payer: Self-pay | Admitting: Cardiovascular Disease

## 2022-09-13 ENCOUNTER — Encounter (HOSPITAL_COMMUNITY)
Admission: RE | Disposition: A | Discharge status: Home / Self Care | Payer: Self-pay | Source: Home / Self Care | Attending: Cardiovascular Disease

## 2022-09-13 ENCOUNTER — Ambulatory Visit (HOSPITAL_COMMUNITY)
Admission: RE | Admit: 2022-09-13 | Discharge: 2022-09-13 | Disposition: A | Discharge status: Home / Self Care | Payer: Medicare Other | Attending: Cardiovascular Disease | Admitting: Cardiovascular Disease

## 2022-09-13 DIAGNOSIS — Z87891 Personal history of nicotine dependence: Secondary | ICD-10-CM | POA: Diagnosis not present

## 2022-09-13 DIAGNOSIS — I252 Old myocardial infarction: Secondary | ICD-10-CM | POA: Diagnosis not present

## 2022-09-13 DIAGNOSIS — Z7902 Long term (current) use of antithrombotics/antiplatelets: Secondary | ICD-10-CM | POA: Diagnosis not present

## 2022-09-13 DIAGNOSIS — I25119 Atherosclerotic heart disease of native coronary artery with unspecified angina pectoris: Secondary | ICD-10-CM | POA: Diagnosis present

## 2022-09-13 DIAGNOSIS — Z955 Presence of coronary angioplasty implant and graft: Secondary | ICD-10-CM | POA: Diagnosis not present

## 2022-09-13 DIAGNOSIS — Z7982 Long term (current) use of aspirin: Secondary | ICD-10-CM | POA: Diagnosis not present

## 2022-09-13 DIAGNOSIS — I2582 Chronic total occlusion of coronary artery: Secondary | ICD-10-CM | POA: Insufficient documentation

## 2022-09-13 HISTORY — PX: CORONARY ANGIOGRAPHY: CATH118303

## 2022-09-13 HISTORY — PX: CORONARY STENT INTERVENTION: CATH118234

## 2022-09-13 LAB — POCT ACTIVATED CLOTTING TIME
Activated Clotting Time: 275 seconds
Activated Clotting Time: 293 seconds

## 2022-09-13 SURGERY — CORONARY STENT INTERVENTION
Anesthesia: LOCAL

## 2022-09-13 MED ORDER — NITROGLYCERIN 1 MG/10 ML FOR IR/CATH LAB
INTRA_ARTERIAL | Status: AC
  Filled 2022-09-13: qty 10

## 2022-09-13 MED ORDER — HEPARIN (PORCINE) IN NACL 1000-0.9 UT/500ML-% IV SOLN
INTRAVENOUS | Status: AC
  Filled 2022-09-13: qty 1000

## 2022-09-13 MED ORDER — MIDAZOLAM HCL 2 MG/2ML IJ SOLN
INTRAMUSCULAR | Status: AC
  Filled 2022-09-13: qty 2

## 2022-09-13 MED ORDER — CLOPIDOGREL BISULFATE 75 MG PO TABS
75.0000 mg | ORAL_TABLET | ORAL | Status: AC
  Administered 2022-09-13: 75 mg via ORAL
  Filled 2022-09-13: qty 1

## 2022-09-13 MED ORDER — FENTANYL CITRATE (PF) 100 MCG/2ML IJ SOLN
INTRAMUSCULAR | Status: AC
  Filled 2022-09-13: qty 2

## 2022-09-13 MED ORDER — LABETALOL HCL 5 MG/ML IV SOLN: 10.0000 mg | INTRAVENOUS | Status: DC | PRN

## 2022-09-13 MED ORDER — MIDAZOLAM HCL 2 MG/2ML IJ SOLN
INTRAMUSCULAR | Status: DC | PRN
  Administered 2022-09-13: 2 mg via INTRAVENOUS
  Administered 2022-09-13: 1 mg via INTRAVENOUS

## 2022-09-13 MED ORDER — ONDANSETRON HCL 4 MG/2ML IJ SOLN: 4.0000 mg | Freq: Four times a day (QID) | INTRAMUSCULAR | Status: DC | PRN

## 2022-09-13 MED ORDER — SODIUM CHLORIDE 0.9% FLUSH: 3.0000 mL | INTRAVENOUS | Status: DC | PRN

## 2022-09-13 MED ORDER — LIDOCAINE HCL (PF) 1 % IJ SOLN
INTRAMUSCULAR | Status: DC | PRN
  Administered 2022-09-13: 15 mL

## 2022-09-13 MED ORDER — SODIUM CHLORIDE 0.9 % WEIGHT BASED INFUSION: 1.0000 mL/kg/h | INTRAVENOUS | Status: DC

## 2022-09-13 MED ORDER — SODIUM CHLORIDE 0.9 % IV SOLN: 250.0000 mL | INTRAVENOUS | Status: DC | PRN

## 2022-09-13 MED ORDER — SODIUM CHLORIDE 0.9% FLUSH: 3.0000 mL | Freq: Two times a day (BID) | INTRAVENOUS | Status: DC

## 2022-09-13 MED ORDER — IOHEXOL 350 MG/ML SOLN
INTRAVENOUS | Status: DC | PRN
  Administered 2022-09-13: 120 mL via INTRA_ARTERIAL

## 2022-09-13 MED ORDER — FENTANYL CITRATE (PF) 100 MCG/2ML IJ SOLN
INTRAMUSCULAR | Status: DC | PRN
  Administered 2022-09-13 (×2): 25 ug via INTRAVENOUS

## 2022-09-13 MED ORDER — HYDRALAZINE HCL 20 MG/ML IJ SOLN: 10.0000 mg | INTRAMUSCULAR | Status: DC | PRN

## 2022-09-13 MED ORDER — HEPARIN SODIUM (PORCINE) 1000 UNIT/ML IJ SOLN
INTRAMUSCULAR | Status: DC | PRN
  Administered 2022-09-13: 8000 [IU] via INTRAVENOUS
  Administered 2022-09-13: 2000 [IU] via INTRAVENOUS

## 2022-09-13 MED ORDER — LIDOCAINE-EPINEPHRINE 1 %-1:100000 IJ SOLN
INTRAMUSCULAR | Status: AC
  Filled 2022-09-13: qty 1

## 2022-09-13 MED ORDER — ACETAMINOPHEN 325 MG PO TABS: 650.0000 mg | ORAL_TABLET | ORAL | Status: DC | PRN

## 2022-09-13 MED ORDER — HEPARIN (PORCINE) IN NACL 1000-0.9 UT/500ML-% IV SOLN
INTRAVENOUS | Status: DC | PRN
  Administered 2022-09-13 (×2): 500 mL

## 2022-09-13 MED ORDER — ASPIRIN 81 MG PO CHEW: 81.0000 mg | CHEWABLE_TABLET | ORAL | Status: DC

## 2022-09-13 MED ORDER — LIDOCAINE HCL (PF) 1 % IJ SOLN
INTRAMUSCULAR | Status: AC
  Filled 2022-09-13: qty 30

## 2022-09-13 MED ORDER — SODIUM CHLORIDE 0.9 % WEIGHT BASED INFUSION
3.0000 mL/kg/h | INTRAVENOUS | Status: AC
  Administered 2022-09-13: 3 mL/kg/h via INTRAVENOUS

## 2022-09-13 MED ORDER — NITROGLYCERIN 1 MG/10 ML FOR IR/CATH LAB
INTRA_ARTERIAL | Status: DC | PRN
  Administered 2022-09-13: 100 ug via INTRACORONARY
  Administered 2022-09-13: 150 ug via INTRACORONARY

## 2022-09-13 SURGICAL SUPPLY — 22 items
BALL SAPPHIRE NC24 3.25X12 (BALLOONS) ×1
BALLN SAPPHIRE 2.5X15 (BALLOONS) ×1
BALLOON SAPPHIRE 2.5X15 (BALLOONS) IMPLANT
BALLOON SAPPHIRE NC24 3.25X12 (BALLOONS) IMPLANT
CATH INFINITI 5FR JL4 (CATHETERS) IMPLANT
CATH INFINITI JR4 5F (CATHETERS) IMPLANT
CATH LAUNCHER 6FR AL.75 (CATHETERS) IMPLANT
CLOSURE PERCLOSE PROSTYLE (VASCULAR PRODUCTS) IMPLANT
ELECT DEFIB PAD ADLT CADENCE (PAD) IMPLANT
KIT ENCORE 26 ADVANTAGE (KITS) IMPLANT
KIT HEART LEFT (KITS) ×2 IMPLANT
KIT HEMO VALVE WATCHDOG (MISCELLANEOUS) IMPLANT
KIT MICROPUNCTURE NIT STIFF (SHEATH) IMPLANT
PACK CARDIAC CATHETERIZATION (CUSTOM PROCEDURE TRAY) ×2 IMPLANT
SHEATH PINNACLE 6F 10CM (SHEATH) IMPLANT
SHEATH PROBE COVER 6X72 (BAG) IMPLANT
STENT SYNERGY XD 2.75X20 (Permanent Stent) IMPLANT
SYNERGY XD 2.75X20 (Permanent Stent) ×1 IMPLANT
TRANSDUCER W/STOPCOCK (MISCELLANEOUS) ×2 IMPLANT
TUBING CIL FLEX 10 FLL-RA (TUBING) ×2 IMPLANT
WIRE COUGAR XT STRL 190CM (WIRE) IMPLANT
WIRE EMERALD 3MM-J .035X150CM (WIRE) IMPLANT

## 2022-09-13 NOTE — Progress Notes (Signed)
Seen pt from [id number] pt was educated on stent location, stent card,  Aspirin & Plavix, restrictions, med use, ex guidelines, NTG use, heart healthy diet, and CRPII. Pt is being referred to CRPII at [facility] in [facility].   Christen Bame 09/13/2022 12:27 PM

## 2022-09-13 NOTE — Discharge Summary (Signed)
Discharge Summary for Same Day PCI   Patient ID: Nicholas Reilly MRN: [id number]; DOB: 17-Mar-1941  Admit date: 09/13/2022 Discharge date: 09/13/2022  Primary Care Provider: Karren Cobble, MD  Primary Cardiologist: Sherren Mocha, MD  Primary Electrophysiologist:  None   Discharge Diagnoses    Active Problems:   Coronary artery disease involving native coronary artery of native heart with angina pectoris [facility])   Diagnostic Studies/Procedures    Cardiac Catheterization 09/13/2022:    RPAV lesion is 50% stenosed.   LM lesion is 40% stenosed.   Prox Cx to Mid Cx lesion is 100% stenosed.   Prox RCA to Mid RCA lesion is 25% stenosed.   Mid RCA to Dist RCA lesion is 90% stenosed.   Mid LM to Dist LM lesion is 50% stenosed.   Ramus lesion is 65% stenosed.   A drug-eluting stent was successfully placed using a SYNERGY XD 2.75X20.   Post intervention, there is a 0% residual stenosis.   1.  Moderate 50% distal left main stenosis unchanged over many years based on review of previous cath films 2.  Patent LAD with mild diffuse plaquing but no significant stenosis 3.  Chronic occlusion of the AV circumflex with moderate stenosis of the ramus intermediate branch, also stable over serial cath studies 4.  Severe distal RCA stenosis of 90% beyond the stented segment, treated successfully with PCI using a 2.75 x 20 mm Synergy DES postdilated to high-pressure with a 3.25 mm noncompliant balloon, 0% residual stenosis at procedure completion with TIMI-3 flow.   Continue aspirin and clopidogrel without interruption at least 6 months.  Consider long-term DAPT with history of extensive stenting, history of STEMI, and recurrent ischemic episodes.  Same-day PCI protocol if criteria met.  Diagnostic Dominance: Right  Intervention   _____________   History of Present Illness     Nicholas Reilly is a 81 y.o. male with PMH of HTN, NSTEMI s/p DES x2 to RCA, GERD, HLD who presented to  the office on 10/16 with complaints of chest pain.   The patient initially presented in 2006 at age 39 with unstable angina.  He was noted to have moderate stenosis in the mid circumflex and right coronary arteries at that time.  He underwent PCI with Cypher drug-eluting stents in both vessels.  He then had an inferior wall STEMI in 2017.  He was found to have total occlusion of the mid RCA with high-grade obstruction in the proximal RCA.  Both lesions were treated with PCI with a 3.5 x 16 mm Promus Premier DES in the mid vessel and a 4.0 x 16 mm Promus Premier DES in the proximal vessel.  The circumflex was noted to be chronically occluded at that time with mild to moderate distal left main stenosis, moderate stenosis of the ramus intermedius, and patency of the LAD.  [place] and he was planning to have right inguinal hernia surgery.  He underwent a nuclear stress test showing inferolateral scar with mild to moderate peri-infarct ischemia extending into the lateral region.  He was also noted to have recurrent chest discomfort.  The patient was referred for cardiac catheterization with findings of severe stenosis in the distal RCA and question of total occlusion in that region.  The left main stent was noted to have moderate stenosis.  The patient was told he might need CABG but also there was a recommendation for medical therapy.  He presents  to the office on 10/16 for a second opinion regarding his care.  He is here with his son and daughter-in-law.   The patient reported progressive fatigue and dyspnea with intermittent chest discomfort experiencing a pressure-like sensation in the left chest.  He reported shortness of breath with physical activity.  Symptoms have been progressive with primary limitation being related fatigue and exercise intolerance.  He had no orthopnea, PND, edema, or heart palpitations. Cardiac catheterization was arranged for  further evaluation.  Hospital Course     The patient underwent cardiac cath as noted above with moderate 50% dLM (unchanged), patent LAD with mild diffuse diseasem CTO of AV Lcx with moderate stenosis of ramus intermediate but new severe dRCA stenosis of 90% beyond previously stented segment treated with PCI/DESx1. Plan for DAPT with ASA/plavix for at least 6 months. The patient was seen by cardiac rehab while in short stay. There were no observed complications post cath. Femoral cath site was perclosed but continued to ooz therefore treated with 5cc lido w/epi with resolution. Re-evaluated prior to discharge and found to be stable without any complications. Instructions/precautions regarding cath site care were given prior to discharge.  Nicholas Reilly was seen by Dr. Burt Knack and determined stable for discharge home. Follow up with our office has been arranged. Medications are listed below. Pertinent changes include N/a .    _____________  Cath/PCI Registry Performance & Quality Measures: Aspirin prescribed? - Yes ADP Receptor Inhibitor (Plavix/Clopidogrel, Brilinta/Ticagrelor or Effient/Prasugrel) prescribed (includes medically managed patients)? - Yes High Intensity Statin (Lipitor 40-80mg  or Crestor 20-40mg ) prescribed? - Yes For EF <40%, was ACEI/ARB prescribed? - Not Applicable (EF >/= 75%) For EF <40%, Aldosterone Antagonist (Spironolactone or Eplerenone) prescribed? - Not Applicable (EF >/= 88%) Cardiac Rehab Phase II ordered (Included Medically managed Patients)? - Yes  _____________   Discharge Vitals Blood pressure 126/63, pulse 70, temperature (!) 97.5 F (36.4 C), temperature source Temporal, resp. rate 16, height 6' (1.829 m), weight 99.8 kg, SpO2 96 %.  Filed Weights   09/13/22 0723  Weight: 99.8 kg    Last Labs & Radiologic Studies    CBC No results for input(s): "WBC", "NEUTROABS", "HGB", "HCT", "MCV", "PLT" in the last 72 hours. Basic Metabolic Panel No results  for input(s): "NA", "K", "CL", "CO2", "GLUCOSE", "BUN", "CREATININE", "CALCIUM", "MG", "PHOS" in the last 72 hours. Liver Function Tests No results for input(s): "AST", "ALT", "ALKPHOS", "BILITOT", "PROT", "ALBUMIN" in the last 72 hours. No results for input(s): "LIPASE", "AMYLASE" in the last 72 hours. High Sensitivity Troponin:   No results for input(s): "TROPONINIHS" in the last 720 hours.  BNP Invalid input(s): "POCBNP" D-Dimer No results for input(s): "DDIMER" in the last 72 hours. Hemoglobin A1C No results for input(s): "HGBA1C" in the last 72 hours. Fasting Lipid Panel No results for input(s): "CHOL", "HDL", "LDLCALC", "TRIG", "CHOLHDL", "LDLDIRECT" in the last 72 hours. Thyroid Function Tests No results for input(s): "TSH", "T4TOTAL", "T3FREE", "THYROIDAB" in the last 72 hours.  Invalid input(s): "FREET3" _____________  CARDIAC CATHETERIZATION  Result Date: 09/13/2022   RPAV lesion is 50% stenosed.   LM lesion is 40% stenosed.   Prox Cx to Mid Cx lesion is 100% stenosed.   Prox RCA to Mid RCA lesion is 25% stenosed.   Mid RCA to Dist RCA lesion is 90% stenosed.   Mid LM to Dist LM lesion is 50% stenosed.   Ramus lesion is 65% stenosed.   A drug-eluting stent was successfully placed using a SYNERGY XD 2.75X20.  Post intervention, there is a 0% residual stenosis. 1.  Moderate 50% distal left main stenosis unchanged over many years based on review of previous cath films 2.  Patent LAD with mild diffuse plaquing but no significant stenosis 3.  Chronic occlusion of the AV circumflex with moderate stenosis of the ramus intermediate branch, also stable over serial cath studies 4.  Severe distal RCA stenosis of 90% beyond the stented segment, treated successfully with PCI using a 2.75 x 20 mm Synergy DES postdilated to high-pressure with a 3.25 mm noncompliant balloon, 0% residual stenosis at procedure completion with TIMI-3 flow. Continue aspirin and clopidogrel without interruption at least  6 months.  Consider long-term DAPT with history of extensive stenting, history of STEMI, and recurrent ischemic episodes.  Same-day PCI protocol if criteria met.    Disposition   Pt is being discharged home today in good condition.  Follow-up Plans & Appointments     Follow-up Information     Liliane Shi, PA-C Follow up on 09/24/2022.   Specialties: Cardiology, Physician Assistant Why: at 10:50am for your follow up with Dr. York Cerise' PA Scott Contact information: [address] [place] [address] [phone]                Discharge Instructions     Amb Referral to Cardiac Rehabilitation   Complete by: As directed    [facility] in [facility]   Diagnosis: Coronary Stents   After initial evaluation and assessments completed: Virtual Based Care may be provided alone or in conjunction with Phase 2 Cardiac Rehab based on patient barriers.: Yes   Intensive Cardiac Rehabilitation (ICR) [place] location only OR Traditional Cardiac Rehabilitation (TCR) *If criteria for ICR are not met will enroll in TCR [facility] only): Yes        Discharge Medications   Allergies as of 09/13/2022       Reactions   Doxycycline Nausea And Vomiting   Other Other (See Comments)   Several foods especially spicy foods cause congestion        Medication List     TAKE these medications    amLODipine 5 MG tablet Commonly known as: NORVASC Take 5 mg by mouth daily.   aspirin EC 81 MG tablet Take 81 mg by mouth at bedtime.   atorvastatin 80 MG tablet Commonly known as: LIPITOR Take 80 mg by mouth daily.   clopidogrel 75 MG tablet Commonly known as: PLAVIX Take 75 mg by mouth daily.   diphenhydrAMINE 25 MG tablet Commonly known as: BENADRYL Take 25 mg by mouth daily as needed for allergies.   FLUoxetine 20 MG capsule Commonly known as: PROZAC Take 20 mg by mouth daily as needed ("Sweet Pill").   losartan 100 MG tablet Commonly known as: COZAAR Take 100 mg by mouth  daily.   Multivitamin Adult Chew Chew 2 each by mouth daily.   nitroGLYCERIN 0.4 MG SL tablet Commonly known as: NITROSTAT Place 0.4 mg under the tongue every 5 (five) minutes as needed for chest pain.   pantoprazole 40 MG tablet Commonly known as: PROTONIX Take 40 mg by mouth daily.   Systane 0.4-0.3 % Soln Generic drug: Polyethyl Glycol-Propyl Glycol Place 1 drop into both eyes as needed (Dry eyes).           Allergies Allergies  Allergen Reactions   Doxycycline Nausea And Vomiting   Other Other (See Comments)    Several foods especially spicy foods cause congestion    Outstanding Labs/Studies  N/a   Duration of Discharge Encounter   Greater than 30 minutes including physician time.  Signed, Reino Bellis, NP 09/13/2022, 1:53 PM

## 2022-09-13 NOTE — Interval H&P Note (Signed)
Cath Lab Visit (complete for each Cath Lab visit)  Clinical Evaluation Leading to the Procedure:   ACS: No.  Non-ACS:    Anginal Classification: CCS II  Anti-ischemic medical therapy: Minimal Therapy (1 class of medications)  Non-Invasive Test Results: Intermediate-risk stress test findings: cardiac mortality 1-3%/year  Prior CABG: No previous CABG      History and Physical Interval Note:  09/13/2022 8:20 AM  Nicholas Reilly  has presented today for surgery, with the diagnosis of cad.  The various methods of treatment have been discussed with the patient and family. After consideration of risks, benefits and other options for treatment, the patient has consented to  Procedure(s): CORONARY STENT INTERVENTION (N/A) as a surgical intervention.  The patient's history has been reviewed, patient examined, no change in status, stable for surgery.  I have reviewed the patient's chart and labs.  Questions were answered to the patient's satisfaction.     Sherren Mocha

## 2022-09-16 ENCOUNTER — Encounter (HOSPITAL_COMMUNITY): Payer: Self-pay | Admitting: Cardiovascular Disease

## 2022-09-20 ENCOUNTER — Ambulatory Visit: Payer: PRIVATE HEALTH INSURANCE | Admitting: Cardiovascular Disease

## 2022-09-24 ENCOUNTER — Ambulatory Visit: Payer: Medicare Other | Admitting: Physician Assistant

## 2022-09-25 ENCOUNTER — Telehealth (HOSPITAL_COMMUNITY): Payer: Self-pay

## 2022-09-25 NOTE — Telephone Encounter (Signed)
Per phase I cardiac rehab, fax referral to [facility].

## 2022-10-04 ENCOUNTER — Ambulatory Visit: Payer: PRIVATE HEALTH INSURANCE | Admitting: Physician Assistant

## 2022-10-11 ENCOUNTER — Ambulatory Visit: Payer: Medicare Other | Admitting: Nurse Practitioner

## 2022-10-15 NOTE — Progress Notes (Unsigned)
Office Visit    Patient Name: Nicholas Reilly Date of Encounter: 10/16/2022  Primary Care Provider:  Laurance Flatten, MD Primary Cardiologist:  Tonny Bollman, MD Primary Electrophysiologist: None  Chief Complaint    Nicholas Reilly is a 81 y.o. male with PMH of CAD s/p DES to mid circumflex and right RCA, inferior STEMI 2017 treated with DES x 2 to proximal RCA, GERD, HLD who presents today for post PCI follow-up.  Past Medical History    Past Medical History:  Diagnosis Date   Coronary artery disease 2006   DES RCA & CFX   CORONARY ARTERY DISEASE 11/29/2008   Qualifier: Diagnosis of  By: Ninetta Lights MD, Tinnie Gens     Esophageal abnormality    GERD (gastroesophageal reflux disease)    Hypercholesteremia    Hypertension    Non-ST elevation MI (NSTEMI) (HCC) 01/20/2016   DES x 2 RCA   Past Surgical History:  Procedure Laterality Date   CARDIAC CATHETERIZATION N/A 01/20/2016   Procedure: Left Heart Cath and Coronary Angiography;  Surgeon: Lyn Records, MD; LM 40%, LAD OK, RI 50%, CFX ISR CTO, pRCA 85%, mRCA 100%, EF 45-50%    CARDIAC CATHETERIZATION N/A 01/20/2016   Procedure: Coronary Stent Intervention;  Surgeon: Lyn Records, MD;  3.5 x 16 Promus Premier DES mRCA 100%>>0%, 4.0 x 16 Promus Premier DES pRCA 85%>>0%   CORONARY ANGIOGRAPHY N/A 09/13/2022   Procedure: CORONARY ANGIOGRAPHY;  Surgeon: Tonny Bollman, MD;  Location: [facility] INVASIVE CV LAB;  Service: Cardiovascular;  Laterality: N/A;   CORONARY ANGIOPLASTY     CORONARY STENT INTERVENTION N/A 09/13/2022   Procedure: CORONARY STENT INTERVENTION;  Surgeon: Tonny Bollman, MD;  Location: [facility] INVASIVE CV LAB;  Service: Cardiovascular;  Laterality: N/A;   CORONARY STENT PLACEMENT     ESOPHAGOGASTRODUODENOSCOPY (EGD) WITH ESOPHAGEAL DILATION N/A 05/09/2013   Procedure: ESOPHAGOGASTRODUODENOSCOPY (EGD) WITH ESOPHAGEAL DILATION;  Surgeon: Theda Belfast, MD;  Location: [facility] ENDOSCOPY;  Service: Endoscopy;  Laterality: N/A;     Allergies  Allergies  Allergen Reactions   Doxycycline Nausea And Vomiting   Other Other (See Comments)    Several foods especially spicy foods cause congestion    History of Present Illness    Nicholas Reilly  is a 81 year old male with the above mention past medical history who presents today for post PCI follow-up.  Nicholas Reilly has an extensive coronary history dating back to 2006 when he underwent DES x 2 to RCA and circumflex.  He was treated for inferior STEMI in 2017 with DES x 2 to RCA for high-grade total occlusion.  He was seen in the office on 08/2022 with complaint of fatigue and dyspnea with intermittent chest discomfort.  2D echo was completed 08/2022 that showed EF of 50-55%, with mild increased wall thickness, grade 1 DD with mild AV stenosis.  He underwent cardiac cath on 08/26/2022 [facility] that revealed subtotal occluded distal RCA and chronic total occlusion of the proximal left circumflex.  He underwent staged PCI on 09/13/2022 with a DES placed to severe distal RCA stenosis.  Patient was placed on DAPT for 6 months with aspirin and Plavix.  [name] presents today for post PCI follow-up.  Since last being seen in the office patient reports he has been doing well with no complaints of chest pain since his previous procedure.  His blood pressure today was 140/80 which is consistent with his home readings.  He is compliant with his current medication  regimen and denies any adverse reactions.  During his visit we reviewed his previous heart cath results.  We also reviewed the importance of physical activity and diet regarding progressing cardiovascular disease.  Patient denies chest pain, palpitations, dyspnea, PND, orthopnea, nausea, vomiting, dizziness, syncope, edema, weight gain, or early satiety.  Home Medications    Current Outpatient Medications  Medication Sig Dispense Refill   amLODipine (NORVASC) 5 MG tablet Take 5 mg by mouth daily.     aspirin EC 81  MG tablet Take 81 mg by mouth at bedtime.     atorvastatin (LIPITOR) 80 MG tablet Take 80 mg by mouth daily.     clopidogrel (PLAVIX) 75 MG tablet Take 75 mg by mouth daily.     diphenhydrAMINE (BENADRYL) 25 MG tablet Take 25 mg by mouth daily as needed for allergies.     FLUoxetine (PROZAC) 20 MG capsule Take 20 mg by mouth daily as needed ("Sweet Pill").     losartan (COZAAR) 100 MG tablet Take 100 mg by mouth daily.     Multiple Vitamins-Minerals (MULTIVITAMIN ADULT) CHEW Chew 2 each by mouth daily.     nitroGLYCERIN (NITROSTAT) 0.4 MG SL tablet Place 0.4 mg under the tongue every 5 (five) minutes as needed for chest pain.     pantoprazole (PROTONIX) 40 MG tablet Take 40 mg by mouth daily.     Polyethyl Glycol-Propyl Glycol (SYSTANE) 0.4-0.3 % SOLN Place 1 drop into both eyes as needed (Dry eyes).     No current facility-administered medications for this visit.     Review of Systems  Please see the history of present illness.    (+) Fatigue when standing  All other systems reviewed and are otherwise negative except as noted above.  Physical Exam    Wt Readings from Last 3 Encounters:  10/16/22 231 lb (104.8 kg)  09/13/22 220 lb (99.8 kg)  09/09/22 222 lb 6.4 oz (100.9 kg)   VS: Vitals:   10/16/22 1529  BP: (!) 140/80  Pulse: 91  ,Body mass index is 31.33 kg/m.  Constitutional:      Appearance: Healthy appearance. Not in distress.  Neck:     Vascular: JVD normal.  Pulmonary:     Effort: Pulmonary effort is normal.     Breath sounds: No wheezing. No rales. Diminished in the bases Cardiovascular:     Normal rate. Regular rhythm. Normal S1. Normal S2.      Murmurs: There is no murmur.  Edema:    Peripheral edema absent.  Abdominal:     Palpations: Abdomen is soft non tender. There is no hepatomegaly.  Skin:    General: Skin is warm and dry.  Neurological:     General: No focal deficit present.     Mental Status: Alert and oriented to person, place and time.      Cranial Nerves: Cranial nerves are intact.  EKG/LABS/Other Studies Reviewed    ECG personally reviewed by me today -sinus rhythm with rate of 91 bpm and no acute changes compared to previous EKG.    Lab Results  Component Value Date   WBC 9.7 09/09/2022   HGB 17.0 09/09/2022   HCT 46.0 09/09/2022   MCV 93 09/09/2022   PLT 236 09/09/2022   Lab Results  Component Value Date   CREATININE 1.01 09/09/2022   BUN 20 09/09/2022   NA 140 09/09/2022   K 4.6 09/09/2022   CL 103 09/09/2022   CO2 28 09/09/2022   Lab Results  Component Value Date   ALT 40 10/28/2013   AST 31 10/28/2013   ALKPHOS 38 (L) 10/28/2013   BILITOT 0.5 10/28/2013   Lab Results  Component Value Date   CHOL 143 10/28/2013   HDL 31 (L) 10/28/2013   LDLCALC 90 10/28/2013   TRIG 111 10/28/2013   CHOLHDL 4.6 10/28/2013    No results found for: "HGBA1C"  Assessment & Plan    1.  Coronary artery disease: -s/p inferior STEMI 2017 treated with DES x 2 to RCA and previous stent placed in 2006 to circumflex and RCA. -Patient recently underwent staged PCI with DES/PCI placed to distal RCA -Today patient reports no chest pain or shortness of breath -Continue GDMT with Lipitor 80 mg, aspirin 81 mg, Plavix 75 mg  2.  Essential hypertension: -Patient's blood pressure today was 140/80 -Continue amlodipine 5 mg, losartan 100 mg  3.  Hyperlipidemia: -Continue Lipitor 80 mg daily  4.  Obstructive sleep apnea: -Patient reports compliance  Disposition: Follow-up with Tonny Bollman, MD as scheduled    Medication Adjustments/Labs and Tests Ordered: Current medicines are reviewed at length with the patient today.  Concerns regarding medicines are outlined above.   Signed, Napoleon Form, Leodis Rains, NP 10/16/2022, 4:40 PM [facility]  Note:  This document was prepared using Dragon voice recognition software and may include unintentional dictation errors.

## 2022-10-16 ENCOUNTER — Encounter: Payer: Self-pay | Admitting: Nurse Practitioner

## 2022-10-16 ENCOUNTER — Ambulatory Visit: Payer: Medicare Other | Attending: Nurse Practitioner | Admitting: Nurse Practitioner

## 2022-10-16 VITALS — BP 140/80 | HR 91 | Ht 72.0 in | Wt 231.0 lb

## 2022-10-16 DIAGNOSIS — E785 Hyperlipidemia, unspecified: Secondary | ICD-10-CM

## 2022-10-16 DIAGNOSIS — I25119 Atherosclerotic heart disease of native coronary artery with unspecified angina pectoris: Secondary | ICD-10-CM | POA: Diagnosis not present

## 2022-10-16 DIAGNOSIS — G4733 Obstructive sleep apnea (adult) (pediatric): Secondary | ICD-10-CM

## 2022-10-16 DIAGNOSIS — I10 Essential (primary) hypertension: Secondary | ICD-10-CM | POA: Diagnosis not present

## 2022-10-16 NOTE — Patient Instructions (Signed)
Medication Instructions:  Your physician recommends that you continue on your current medications as directed. Please refer to the Current Medication list given to you today. *If you need a refill on your cardiac medications before your next appointment, please call your pharmacy*   Lab Work: None Ordered   Testing/Procedures: None ordered    Follow-Up: At [facility], you and your health needs are our priority.  As part of our continuing mission to provide you with exceptional heart care, we have created designated Provider Care Teams.  These Care Teams include your primary Cardiologist (physician) and Advanced Practice Providers (APPs -  Physician Assistants and Nurse Practitioners) who all work together to provide you with the care you need, when you need it.  We recommend signing up for the patient portal called "MyChart".  Sign up information is provided on this After Visit Summary.  MyChart is used to connect with patients for Virtual Visits (Telemedicine).  Patients are able to view lab/test results, encounter notes, upcoming appointments, etc.  Non-urgent messages can be sent to your provider as well.   To learn more about what you can do with MyChart, go to [email or web address].    Your next appointment:   With your provider in [facility]    Other Instructions   Important Information About Sugar

## 2023-02-21 ENCOUNTER — Emergency Department (HOSPITAL_COMMUNITY): Payer: Medicare Other

## 2023-02-21 ENCOUNTER — Encounter (HOSPITAL_COMMUNITY): Payer: Self-pay

## 2023-02-21 ENCOUNTER — Other Ambulatory Visit: Payer: Self-pay

## 2023-02-21 ENCOUNTER — Emergency Department (HOSPITAL_COMMUNITY)
Admission: EM | Admit: 2023-02-21 | Discharge: 2023-02-22 | Discharge status: Against Medical Advice | Payer: Medicare Other | Attending: Emergency Medicine | Admitting: Emergency Medicine

## 2023-02-21 DIAGNOSIS — M25531 Pain in right wrist: Secondary | ICD-10-CM | POA: Diagnosis not present

## 2023-02-21 DIAGNOSIS — Z5321 Procedure and treatment not carried out due to patient leaving prior to being seen by health care provider: Secondary | ICD-10-CM | POA: Diagnosis not present

## 2023-02-21 DIAGNOSIS — R0602 Shortness of breath: Secondary | ICD-10-CM | POA: Diagnosis not present

## 2023-02-21 DIAGNOSIS — R079 Chest pain, unspecified: Secondary | ICD-10-CM | POA: Diagnosis present

## 2023-02-21 LAB — BASIC METABOLIC PANEL
Anion gap: 11 (ref 5–15)
BUN: 12 mg/dL (ref 8–23)
CO2: 26 mmol/L (ref 22–32)
Calcium: 9 mg/dL (ref 8.9–10.3)
Chloride: 102 mmol/L (ref 98–111)
Creatinine, Ser: 1.03 mg/dL (ref 0.61–1.24)
GFR, Estimated: >60 mL/min (ref >60)
Glucose, Bld: 111 mg/dL — ABNORMAL HIGH (ref 70–99)
Potassium: 4.1 mmol/L (ref 3.5–5.1)
Sodium: 139 mmol/L (ref 135–145)

## 2023-02-21 LAB — CBC
HCT: 41.4 % (ref 39.0–52.0)
Hemoglobin: 15.5 g/dL (ref 13.0–17.0)
MCH: 35.2 pg — ABNORMAL HIGH (ref 26.0–34.0)
MCHC: 37.4 g/dL — ABNORMAL HIGH (ref 30.0–36.0)
MCV: 94.1 fL (ref 80.0–100.0)
Platelets: 221 10*3/uL (ref 150–400)
RBC: 4.4 MIL/uL (ref 4.22–5.81)
RDW: 14.4 % (ref 11.5–15.5)
WBC: 7.7 10*3/uL (ref 4.0–10.5)
nRBC: 0 % (ref 0.0–0.2)

## 2023-02-21 LAB — TROPONIN I (HIGH SENSITIVITY)
Troponin I (High Sensitivity): 12 ng/L (ref <18)
Troponin I (High Sensitivity): 13 ng/L (ref <18)

## 2023-02-21 NOTE — ED Triage Notes (Signed)
Pt came in via POV d/t CP that started yesterday with pressure in his Chest & radiate to Lt arm then it subsided. Then he woke up this morning it had returned & then hurt on his Rt arm. Pt reports he does have Hx of stents back in Oct last year & reports CP of 5/10 while in triage, A/Ox4. Now feels lethargic, blurred vision & restless.

## 2023-02-21 NOTE — ED Triage Notes (Signed)
Pt states he is going to leave and he will look on mychart for results.

## 2023-02-21 NOTE — ED Provider Triage Note (Signed)
Emergency Medicine Provider Triage Evaluation Note  Nicholas Reilly , a 82 y.o. male  was evaluated in triage.  Pt complains of onset of right wrist and arm pain radiating up the arm and now over to the left side arm.  He has associated shortness of breath.  This began yesterday.  He had similar symptoms when he needed to have a stent placed..  Review of Systems  Positive: Arm pain bilaterally shortness of breath Negative: Pain nausea or diaphoresis  Physical Exam  There were no vitals taken for this visit. Gen:   Awake, no distress   Resp:  Normal effort  MSK:   Moves extremities without difficulty  Other:    Medical Decision Making  Medically screening exam initiated at 5:54 PM.  Appropriate orders placed.  [name] was informed that the remainder of the evaluation will be completed by another provider, this initial triage assessment does not replace that evaluation, and the importance of remaining in the ED until their evaluation is complete.  Took nitroglycerin yesterday and the day before without improvement   Margarita Mail, PA-C 02/21/23 1755

## 2024-10-07 ENCOUNTER — Telehealth: Payer: Self-pay | Admitting: Cardiovascular Disease

## 2024-10-07 NOTE — Telephone Encounter (Signed)
 Called patient back about message. Patient stated for the last 3 months he has had SOB with activity and it has been getting worse. Patient stated he is unable to stated for long periods of time and his feet hurt, stating it feels like he is waling on broken glass. Patient stated he sees cardiologist in [facility], but he does not trust them. Patient stated he needs to see Dr. Wonda ASAP. Patient stated to let him know his niece is Tobias Hua, who helped him get in with Dr. Wonda the last time. Made patient an appointment with Dr. Wonda on Monday. Encouraged patietn to follow-up with his PCP about his feet. Informed patient to go to the ED if his symptoms get worse. Will forward to Dr. Wonda for further advisement.

## 2024-10-07 NOTE — Telephone Encounter (Signed)
 Pt c/o Shortness Of Breath: STAT if SOB developed within the last 24 hours or pt is noticeably SOB on the phone  1. Are you currently SOB (can you hear that pt is SOB on the phone)? no  2. How long have you been experiencing SOB? 3 months but is getting worse  3. Are you SOB when sitting or when up moving around? Moving around  4. Are you currently experiencing any other symptoms? Dizziness

## 2024-10-11 ENCOUNTER — Other Ambulatory Visit (HOSPITAL_COMMUNITY): Payer: Self-pay

## 2024-10-11 ENCOUNTER — Other Ambulatory Visit: Payer: Self-pay

## 2024-10-11 ENCOUNTER — Encounter: Payer: Self-pay | Admitting: Cardiovascular Disease

## 2024-10-11 ENCOUNTER — Ambulatory Visit: Attending: Cardiovascular Disease | Admitting: Cardiovascular Disease

## 2024-10-11 VITALS — BP 130/70 | HR 87 | Ht 72.0 in | Wt 232.4 lb

## 2024-10-11 DIAGNOSIS — E782 Mixed hyperlipidemia: Secondary | ICD-10-CM | POA: Diagnosis not present

## 2024-10-11 DIAGNOSIS — I350 Nonrheumatic aortic (valve) stenosis: Secondary | ICD-10-CM | POA: Diagnosis not present

## 2024-10-11 DIAGNOSIS — I10 Essential (primary) hypertension: Secondary | ICD-10-CM

## 2024-10-11 DIAGNOSIS — I25119 Atherosclerotic heart disease of native coronary artery with unspecified angina pectoris: Secondary | ICD-10-CM

## 2024-10-11 MED ORDER — CLOPIDOGREL BISULFATE 75 MG PO TABS
75.0000 mg | ORAL_TABLET | Freq: Every day | ORAL | 3 refills | Status: AC
  Filled 2024-10-11: qty 90, 90d supply, fill #0

## 2024-10-11 NOTE — Progress Notes (Signed)
 Cardiology Office Note:    Date:  10/11/2024   ID:  Nicholas Reilly, DOB 09/24/1941, MRN [id number]  PCP:  Ina Quintin [place], MD   [facility] Providers Cardiologist:  Ozell Fell, MD     Referring MD: Ina Quintin [place], MD   Chief Complaint  Patient presents with   Shortness of Breath    History of Present Illness:    Nicholas Reilly is a 83 y.o. male with a hx of coronary artery disease, presenting for follow-up evaluation.  The patient initially presented with unstable angina in 2006 and was treated with stenting of the right coronary artery and left circumflex vessels.  He presented with inferior STEMI in 2017 and was found to have total occlusion of the mid RCA with high-grade proximal RCA obstruction, treated with overlapping drug-eluting stents.  The circumflex was chronically occluded.  There was mild to moderate distal left main stenosis, moderate stenosis of the ramus intermedius, and patency of the LAD.  He has been managed medically since that time.  He developed recurrent chest pain and underwent cardiac catheterization again showing severe stenosis of the distal RCA.  The patient was told he might need multivessel CABG but ultimately was managed medically.  He came to see me for a second opinion in 2023.  At that time I performed a cardiac catheterization and this confirmed moderate 50% distal left main stenosis, stable over serial cardiac catheterization studies, patency of the LAD with mild diffuse nonobstructive plaquing, chronic occlusion of the AV circumflex and moderate stenosis of the ramus intermedius branch, both stable again.  The RCA had a severe 90% stenosis beyond the stented segment and this was treated successfully with PCI using a 2.75 x 20 mm Synergy DES.  The patient was recently seen by his primary cardiologist at [facility] for symptoms of progressive dyspnea with exertion and fatigue.  Recommendations were made for repeat  cardiac catheterization.  He presents today for further discussion since I did his most recent procedure.  Note he had difficulty with radial access in the past and I did his last procedure via right femoral access.  The patient is here with his son today.  He reports progressive dyspnea over the last 3 months.  He also has some chest tightness in the center of the chest with exertion.  He complains of fairly profound fatigue and shortness of breath just like he felt prior to his last PCI procedure.  States that he felt very good for the first year and a half after his last stent was placed.  We went back and reviewed his films together.  He had a tight focal stenosis in the distal RCA just off the edge of an old stent that was treated with repeat stenting.  This required use of an Amplatz catheter to engage the RCA from the femoral access.  The patient has quit taking clopidogrel .  He has been compliant with medication recommendations from his primary cardiologist.   Current Medications: Current Meds  Medication Sig   amLODipine (NORVASC) 5 MG tablet Take 5 mg by mouth daily.   atorvastatin  (LIPITOR ) 80 MG tablet Take 80 mg by mouth daily.   diphenhydrAMINE  (BENADRYL ) 25 MG tablet Take 25 mg by mouth daily as needed for allergies.   FLUoxetine (PROZAC) 20 MG capsule Take 20 mg by mouth daily as needed (Sweet Pill).   losartan (COZAAR) 100 MG tablet Take 100 mg by mouth daily.   Multiple Vitamins-Minerals (MULTIVITAMIN ADULT)  CHEW Chew 2 each by mouth daily.   nitroGLYCERIN  (NITROSTAT ) 0.4 MG SL tablet Place 0.4 mg under the tongue every 5 (five) minutes as needed for chest pain.   pantoprazole  (PROTONIX ) 40 MG tablet Take 40 mg by mouth daily. (Patient taking differently: Take 40 mg by mouth as needed.)   Polyethyl Glycol-Propyl Glycol (SYSTANE) 0.4-0.3 % SOLN Place 1 drop into both eyes as needed (Dry eyes).   [DISCONTINUED] aspirin  EC 81 MG tablet Take 81 mg by mouth at bedtime.    [DISCONTINUED] clopidogrel  (PLAVIX ) 75 MG tablet Take 75 mg by mouth daily.     Allergies:   Doxycycline and Other   ROS:   Please see the history of present illness.    All other systems reviewed and are negative.  EKGs/Labs/Other Studies Reviewed:    The following studies were reviewed today: Cardiac Studies & Procedures   ______________________________________________________________________________________________ CARDIAC CATHETERIZATION  CARDIAC CATHETERIZATION 09/13/2022  Conclusion   RPAV lesion is 50% stenosed.   LM lesion is 40% stenosed.   Prox Cx to Mid Cx lesion is 100% stenosed.   Prox RCA to Mid RCA lesion is 25% stenosed.   Mid RCA to Dist RCA lesion is 90% stenosed.   Mid LM to Dist LM lesion is 50% stenosed.   Ramus lesion is 65% stenosed.   A drug-eluting stent was successfully placed using a SYNERGY XD 2.75X20.   Post intervention, there is a 0% residual stenosis.  1.  Moderate 50% distal left main stenosis unchanged over many years based on review of previous cath films 2.  Patent LAD with mild diffuse plaquing but no significant stenosis 3.  Chronic occlusion of the AV circumflex with moderate stenosis of the ramus intermediate branch, also stable over serial cath studies 4.  Severe distal RCA stenosis of 90% beyond the stented segment, treated successfully with PCI using a 2.75 x 20 mm Synergy DES postdilated to high-pressure with a 3.25 mm noncompliant balloon, 0% residual stenosis at procedure completion with TIMI-3 flow.  Continue aspirin  and clopidogrel  without interruption at least 6 months.  Consider long-term DAPT with history of extensive stenting, history of STEMI, and recurrent ischemic episodes.  Same-day PCI protocol if criteria met.  Findings Coronary Findings Diagnostic  Dominance: Right  Left Main LM lesion is 40% stenosed. The lesion is eccentric. Mid LM to Dist LM lesion is 50% stenosed. Unchanged from prior  Left Anterior  Descending There is mild diffuse disease throughout the vessel. The LAD is patent and wraps around the LV apex.  There is mild proximal plaquing without significant stenosis.  The diagonals are patent.  First Diagonal Branch Vessel is small in size.  Second Diagonal Branch Vessel is small in size.  Third Diagonal Branch Vessel is small in size.  Ramus Intermedius Ramus lesion is 65% stenosed. The lesion is eccentric. Unchanged from prior cath  Left Circumflex Prox Cx to Mid Cx lesion is 100% stenosed. The lesion was previously treated .  Second Obtuse Marginal Branch Collaterals 2nd Mrg filled by collaterals from Dist LAD.  Right Coronary Artery Prox RCA to Mid RCA lesion is 25% stenosed. The lesion was previously treated . Mid RCA to Dist RCA lesion is 90% stenosed. The lesion is heavily thrombotic.  Right Posterior Descending Artery Collaterals RPDA filled by collaterals from Dist LAD.  Right Posterior Atrioventricular Artery RPAV lesion is 50% stenosed. The lesion is eccentric.  Intervention  Mid RCA to Dist RCA lesion Stent CATH LAUNCHER 6FR AL.75 guide catheter  was inserted. Lesion crossed with guidewire using a WIRE COUGAR XT STRL 190CM. Pre-stent angioplasty was performed using a BALLN SAPPHIRE 2.5X15. A drug-eluting stent was successfully placed using a SYNERGY XD 2.75X20. Post-stent angioplasty was performed using a BALL SAPPHIRE NC24 3.25X12. Maximum pressure:  20 atm. Post-Intervention Lesion Assessment The intervention was successful. Pre-interventional TIMI flow is 3. Post-intervention TIMI flow is 3. No complications occurred at this lesion. There is a 0% residual stenosis post intervention.   CARDIAC CATHETERIZATION  CARDIAC CATHETERIZATION 08/26/2022              ______________________________________________________________________________________________      EKG:   EKG Interpretation Date/Time:  Monday October 11 2024 14:09:47  EST Ventricular Rate:  87 PR Interval:  164 QRS Duration:  100 QT Interval:  358 QTC Calculation: 430 R Axis:   -8  Text Interpretation: Normal sinus rhythm Normal ECG When compared with ECG of 21-Feb-2023 17:41, No significant change was found Confirmed by Wonda Sharper [phone]) on 10/11/2024 2:21:47 PM    Recent Labs: No results found for requested labs within last 365 days.  Recent Lipid Panel    Component Value Date/Time   CHOL 143 10/28/2013 0922   TRIG 111 10/28/2013 0922   HDL 31 (L) 10/28/2013 0922   CHOLHDL 4.6 10/28/2013 0922   VLDL 22 10/28/2013 0922   LDLCALC 90 10/28/2013 0922     Risk Assessment/Calculations:                Physical Exam:    VS:  BP 130/70 (BP Location: Left Arm, Patient Position: Sitting, Cuff Size: Large)   Pulse 87   Ht 6' (1.829 m)   Wt 232 lb 6.4 oz (105.4 kg)   SpO2 98%   BMI 31.52 kg/m     Wt Readings from Last 3 Encounters:  10/11/24 232 lb 6.4 oz (105.4 kg)  10/16/22 231 lb (104.8 kg)  09/13/22 220 lb (99.8 kg)     GEN:  Well nourished, well developed in no acute distress HEENT: Normal NECK: No JVD; No carotid bruits LYMPHATICS: No lymphadenopathy CARDIAC: RRR, distant heart sounds with a 2/6 crescendo decrescendo murmur at the right upper sternal border RESPIRATORY:  Clear to auscultation without rales, wheezing or rhonchi  ABDOMEN: Soft, non-tender, non-distended MUSCULOSKELETAL:  No edema; No deformity  SKIN: Warm and dry NEUROLOGIC:  Alert and oriented x 3 PSYCHIATRIC:  Normal affect   Assessment & Plan Coronary artery disease involving native coronary artery of native heart with angina pectoris Patient with worsening symptoms of angina.  Shortness of breath has been his anginal equivalent in the past and he also has exertional chest discomfort.  The patient is on carvedilol and amlodipine for antianginal therapy.  I have recommended that he start clopidogrel  300 mg x 1 today then 75 mg daily tomorrow.  His  symptoms are highly suspicious for recurrent coronary stenosis.  I recommended repeat cardiac catheterization via femoral approach. I have reviewed the risks, indications, and alternatives to cardiac catheterization, possible angioplasty, and stenting with the patient. Risks include but are not limited to bleeding, infection, vascular injury, stroke, myocardial infection, arrhythmia, kidney injury, radiation-related injury in the case of prolonged fluoroscopy use, emergency cardiac surgery, and death. The patient understands the risks of serious complication is 1-2 in 1000 with diagnostic cardiac cath and 1-2% or less with angioplasty/stenting.   Hypertension, essential Blood pressure is controlled on amlodipine, carvedilol, and losartan.  Continue the same. Mixed hyperlipidemia Treated with atorvastatin  80 mg daily.  Nonrheumatic aortic (valve) stenosis Patient with aortic stenosis by exam.  Reviewed outside records with her previous echo last year showing mild to moderate aortic stenosis.  Recommend updated 2D echocardiogram (will try to arrange while he is in for his cath procedure since he lives 3 hours away).  Will plan to cross the aortic valve at the time of catheterization and record pressures.       Informed Consent   Shared Decision Making/Informed Consent The risks [stroke (1 in 1000), death (1 in 1000), kidney failure [usually temporary] (1 in 500), bleeding (1 in 200), allergic reaction [possibly serious] (1 in 200)], benefits (diagnostic support and management of coronary artery disease) and alternatives of a cardiac catheterization were discussed in detail with Nicholas Reilly and he is willing to proceed.       Medication Adjustments/Labs and Tests Ordered: Current medicines are reviewed at length with the patient today.  Concerns regarding medicines are outlined above.  Orders Placed This Encounter  Procedures   Basic metabolic panel with GFR   CBC   EKG 12-Lead   Meds ordered  this encounter  Medications   clopidogrel  (PLAVIX ) 75 MG tablet    Sig: Take 1 tablet (75 mg total) by mouth daily.    Dispense:  90 tablet    Refill:  3    Patient Instructions  Medication Instructions:  Your physician has recommended you make the following change in your medication:  1) START taking Plavix  - take 300 mg (4 tablets) today and then 75 mg (1 tablet) once daily thereafter.   *If you need a refill on your cardiac medications before your next appointment, please call your pharmacy*  Lab Work: TODAY: BMET and CBC  Testing/Procedures: Left Heart Cath Your physician has requested that you have a cardiac catheterization. Cardiac catheterization is used to diagnose and/or treat various heart conditions. Doctors may recommend this procedure for a number of different reasons. The most common reason is to evaluate chest pain. Chest pain can be a symptom of coronary artery disease (CAD), and cardiac catheterization can show whether plaque is narrowing or blocking your heart's arteries. This procedure is also used to evaluate the valves, as well as measure the blood flow and oxygen levels in different parts of your heart. For further information please visit [email or web address] Please follow instruction sheet, as given.  Follow-Up: At [facility], you and your health needs are our priority.  As part of our continuing mission to provide you with exceptional heart care, our providers are all part of one team.  This team includes your primary Cardiologist (physician) and Advanced Practice Providers or APPs (Physician Assistants and Nurse Practitioners) who all work together to provide you with the care you need, when you need it.    Signed, Ozell Fell, MD  10/11/2024 5:29 PM     [facility]

## 2024-10-11 NOTE — H&P (View-Only) (Signed)
 Cardiology Office Note:    Date:  10/11/2024   ID:  Nicholas Reilly, DOB 09/24/1941, MRN [id number]  PCP:  Ina Quintin [place], MD   [facility] Providers Cardiologist:  Ozell Fell, MD     Referring MD: Ina Quintin [place], MD   Chief Complaint  Patient presents with   Shortness of Breath    History of Present Illness:    Nicholas Reilly is a 83 y.o. male with a hx of coronary artery disease, presenting for follow-up evaluation.  The patient initially presented with unstable angina in 2006 and was treated with stenting of the right coronary artery and left circumflex vessels.  He presented with inferior STEMI in 2017 and was found to have total occlusion of the mid RCA with high-grade proximal RCA obstruction, treated with overlapping drug-eluting stents.  The circumflex was chronically occluded.  There was mild to moderate distal left main stenosis, moderate stenosis of the ramus intermedius, and patency of the LAD.  He has been managed medically since that time.  He developed recurrent chest pain and underwent cardiac catheterization again showing severe stenosis of the distal RCA.  The patient was told he might need multivessel CABG but ultimately was managed medically.  He came to see me for a second opinion in 2023.  At that time I performed a cardiac catheterization and this confirmed moderate 50% distal left main stenosis, stable over serial cardiac catheterization studies, patency of the LAD with mild diffuse nonobstructive plaquing, chronic occlusion of the AV circumflex and moderate stenosis of the ramus intermedius branch, both stable again.  The RCA had a severe 90% stenosis beyond the stented segment and this was treated successfully with PCI using a 2.75 x 20 mm Synergy DES.  The patient was recently seen by his primary cardiologist at [facility] for symptoms of progressive dyspnea with exertion and fatigue.  Recommendations were made for repeat  cardiac catheterization.  He presents today for further discussion since I did his most recent procedure.  Note he had difficulty with radial access in the past and I did his last procedure via right femoral access.  The patient is here with his son today.  He reports progressive dyspnea over the last 3 months.  He also has some chest tightness in the center of the chest with exertion.  He complains of fairly profound fatigue and shortness of breath just like he felt prior to his last PCI procedure.  States that he felt very good for the first year and a half after his last stent was placed.  We went back and reviewed his films together.  He had a tight focal stenosis in the distal RCA just off the edge of an old stent that was treated with repeat stenting.  This required use of an Amplatz catheter to engage the RCA from the femoral access.  The patient has quit taking clopidogrel .  He has been compliant with medication recommendations from his primary cardiologist.   Current Medications: Current Meds  Medication Sig   amLODipine (NORVASC) 5 MG tablet Take 5 mg by mouth daily.   atorvastatin  (LIPITOR ) 80 MG tablet Take 80 mg by mouth daily.   diphenhydrAMINE  (BENADRYL ) 25 MG tablet Take 25 mg by mouth daily as needed for allergies.   FLUoxetine (PROZAC) 20 MG capsule Take 20 mg by mouth daily as needed (Sweet Pill).   losartan (COZAAR) 100 MG tablet Take 100 mg by mouth daily.   Multiple Vitamins-Minerals (MULTIVITAMIN ADULT)  CHEW Chew 2 each by mouth daily.   nitroGLYCERIN  (NITROSTAT ) 0.4 MG SL tablet Place 0.4 mg under the tongue every 5 (five) minutes as needed for chest pain.   pantoprazole  (PROTONIX ) 40 MG tablet Take 40 mg by mouth daily. (Patient taking differently: Take 40 mg by mouth as needed.)   Polyethyl Glycol-Propyl Glycol (SYSTANE) 0.4-0.3 % SOLN Place 1 drop into both eyes as needed (Dry eyes).   [DISCONTINUED] aspirin  EC 81 MG tablet Take 81 mg by mouth at bedtime.    [DISCONTINUED] clopidogrel  (PLAVIX ) 75 MG tablet Take 75 mg by mouth daily.     Allergies:   Doxycycline and Other   ROS:   Please see the history of present illness.    All other systems reviewed and are negative.  EKGs/Labs/Other Studies Reviewed:    The following studies were reviewed today: Cardiac Studies & Procedures   ______________________________________________________________________________________________ CARDIAC CATHETERIZATION  CARDIAC CATHETERIZATION 09/13/2022  Conclusion   RPAV lesion is 50% stenosed.   LM lesion is 40% stenosed.   Prox Cx to Mid Cx lesion is 100% stenosed.   Prox RCA to Mid RCA lesion is 25% stenosed.   Mid RCA to Dist RCA lesion is 90% stenosed.   Mid LM to Dist LM lesion is 50% stenosed.   Ramus lesion is 65% stenosed.   A drug-eluting stent was successfully placed using a SYNERGY XD 2.75X20.   Post intervention, there is a 0% residual stenosis.  1.  Moderate 50% distal left main stenosis unchanged over many years based on review of previous cath films 2.  Patent LAD with mild diffuse plaquing but no significant stenosis 3.  Chronic occlusion of the AV circumflex with moderate stenosis of the ramus intermediate branch, also stable over serial cath studies 4.  Severe distal RCA stenosis of 90% beyond the stented segment, treated successfully with PCI using a 2.75 x 20 mm Synergy DES postdilated to high-pressure with a 3.25 mm noncompliant balloon, 0% residual stenosis at procedure completion with TIMI-3 flow.  Continue aspirin  and clopidogrel  without interruption at least 6 months.  Consider long-term DAPT with history of extensive stenting, history of STEMI, and recurrent ischemic episodes.  Same-day PCI protocol if criteria met.  Findings Coronary Findings Diagnostic  Dominance: Right  Left Main LM lesion is 40% stenosed. The lesion is eccentric. Mid LM to Dist LM lesion is 50% stenosed. Unchanged from prior  Left Anterior  Descending There is mild diffuse disease throughout the vessel. The LAD is patent and wraps around the LV apex.  There is mild proximal plaquing without significant stenosis.  The diagonals are patent.  First Diagonal Branch Vessel is small in size.  Second Diagonal Branch Vessel is small in size.  Third Diagonal Branch Vessel is small in size.  Ramus Intermedius Ramus lesion is 65% stenosed. The lesion is eccentric. Unchanged from prior cath  Left Circumflex Prox Cx to Mid Cx lesion is 100% stenosed. The lesion was previously treated .  Second Obtuse Marginal Branch Collaterals 2nd Mrg filled by collaterals from Dist LAD.  Right Coronary Artery Prox RCA to Mid RCA lesion is 25% stenosed. The lesion was previously treated . Mid RCA to Dist RCA lesion is 90% stenosed. The lesion is heavily thrombotic.  Right Posterior Descending Artery Collaterals RPDA filled by collaterals from Dist LAD.  Right Posterior Atrioventricular Artery RPAV lesion is 50% stenosed. The lesion is eccentric.  Intervention  Mid RCA to Dist RCA lesion Stent CATH LAUNCHER 6FR AL.75 guide catheter  was inserted. Lesion crossed with guidewire using a WIRE COUGAR XT STRL 190CM. Pre-stent angioplasty was performed using a BALLN SAPPHIRE 2.5X15. A drug-eluting stent was successfully placed using a SYNERGY XD 2.75X20. Post-stent angioplasty was performed using a BALL SAPPHIRE NC24 3.25X12. Maximum pressure:  20 atm. Post-Intervention Lesion Assessment The intervention was successful. Pre-interventional TIMI flow is 3. Post-intervention TIMI flow is 3. No complications occurred at this lesion. There is a 0% residual stenosis post intervention.   CARDIAC CATHETERIZATION  CARDIAC CATHETERIZATION 08/26/2022              ______________________________________________________________________________________________      EKG:   EKG Interpretation Date/Time:  Monday October 11 2024 14:09:47  EST Ventricular Rate:  87 PR Interval:  164 QRS Duration:  100 QT Interval:  358 QTC Calculation: 430 R Axis:   -8  Text Interpretation: Normal sinus rhythm Normal ECG When compared with ECG of 21-Feb-2023 17:41, No significant change was found Confirmed by Wonda Sharper [phone]) on 10/11/2024 2:21:47 PM    Recent Labs: No results found for requested labs within last 365 days.  Recent Lipid Panel    Component Value Date/Time   CHOL 143 10/28/2013 0922   TRIG 111 10/28/2013 0922   HDL 31 (L) 10/28/2013 0922   CHOLHDL 4.6 10/28/2013 0922   VLDL 22 10/28/2013 0922   LDLCALC 90 10/28/2013 0922     Risk Assessment/Calculations:                Physical Exam:    VS:  BP 130/70 (BP Location: Left Arm, Patient Position: Sitting, Cuff Size: Large)   Pulse 87   Ht 6' (1.829 m)   Wt 232 lb 6.4 oz (105.4 kg)   SpO2 98%   BMI 31.52 kg/m     Wt Readings from Last 3 Encounters:  10/11/24 232 lb 6.4 oz (105.4 kg)  10/16/22 231 lb (104.8 kg)  09/13/22 220 lb (99.8 kg)     GEN:  Well nourished, well developed in no acute distress HEENT: Normal NECK: No JVD; No carotid bruits LYMPHATICS: No lymphadenopathy CARDIAC: RRR, distant heart sounds with a 2/6 crescendo decrescendo murmur at the right upper sternal border RESPIRATORY:  Clear to auscultation without rales, wheezing or rhonchi  ABDOMEN: Soft, non-tender, non-distended MUSCULOSKELETAL:  No edema; No deformity  SKIN: Warm and dry NEUROLOGIC:  Alert and oriented x 3 PSYCHIATRIC:  Normal affect   Assessment & Plan Coronary artery disease involving native coronary artery of native heart with angina pectoris Patient with worsening symptoms of angina.  Shortness of breath has been his anginal equivalent in the past and he also has exertional chest discomfort.  The patient is on carvedilol and amlodipine for antianginal therapy.  I have recommended that he start clopidogrel  300 mg x 1 today then 75 mg daily tomorrow.  His  symptoms are highly suspicious for recurrent coronary stenosis.  I recommended repeat cardiac catheterization via femoral approach. I have reviewed the risks, indications, and alternatives to cardiac catheterization, possible angioplasty, and stenting with the patient. Risks include but are not limited to bleeding, infection, vascular injury, stroke, myocardial infection, arrhythmia, kidney injury, radiation-related injury in the case of prolonged fluoroscopy use, emergency cardiac surgery, and death. The patient understands the risks of serious complication is 1-2 in 1000 with diagnostic cardiac cath and 1-2% or less with angioplasty/stenting.   Hypertension, essential Blood pressure is controlled on amlodipine, carvedilol, and losartan.  Continue the same. Mixed hyperlipidemia Treated with atorvastatin  80 mg daily.  Nonrheumatic aortic (valve) stenosis Patient with aortic stenosis by exam.  Reviewed outside records with her previous echo last year showing mild to moderate aortic stenosis.  Recommend updated 2D echocardiogram (will try to arrange while he is in for his cath procedure since he lives 3 hours away).  Will plan to cross the aortic valve at the time of catheterization and record pressures.       Informed Consent   Shared Decision Making/Informed Consent The risks [stroke (1 in 1000), death (1 in 1000), kidney failure [usually temporary] (1 in 500), bleeding (1 in 200), allergic reaction [possibly serious] (1 in 200)], benefits (diagnostic support and management of coronary artery disease) and alternatives of a cardiac catheterization were discussed in detail with Mr. Woodin and he is willing to proceed.       Medication Adjustments/Labs and Tests Ordered: Current medicines are reviewed at length with the patient today.  Concerns regarding medicines are outlined above.  Orders Placed This Encounter  Procedures   Basic metabolic panel with GFR   CBC   EKG 12-Lead   Meds ordered  this encounter  Medications   clopidogrel  (PLAVIX ) 75 MG tablet    Sig: Take 1 tablet (75 mg total) by mouth daily.    Dispense:  90 tablet    Refill:  3    Patient Instructions  Medication Instructions:  Your physician has recommended you make the following change in your medication:  1) START taking Plavix  - take 300 mg (4 tablets) today and then 75 mg (1 tablet) once daily thereafter.   *If you need a refill on your cardiac medications before your next appointment, please call your pharmacy*  Lab Work: TODAY: BMET and CBC  Testing/Procedures: Left Heart Cath Your physician has requested that you have a cardiac catheterization. Cardiac catheterization is used to diagnose and/or treat various heart conditions. Doctors may recommend this procedure for a number of different reasons. The most common reason is to evaluate chest pain. Chest pain can be a symptom of coronary artery disease (CAD), and cardiac catheterization can show whether plaque is narrowing or blocking your heart's arteries. This procedure is also used to evaluate the valves, as well as measure the blood flow and oxygen levels in different parts of your heart. For further information please visit [email or web address] Please follow instruction sheet, as given.  Follow-Up: At [facility], you and your health needs are our priority.  As part of our continuing mission to provide you with exceptional heart care, our providers are all part of one team.  This team includes your primary Cardiologist (physician) and Advanced Practice Providers or APPs (Physician Assistants and Nurse Practitioners) who all work together to provide you with the care you need, when you need it.    Signed, Ozell Fell, MD  10/11/2024 5:29 PM     [facility]

## 2024-10-11 NOTE — Patient Instructions (Addendum)
 Medication Instructions:  Your physician has recommended you make the following change in your medication:  1) START taking Plavix  - take 300 mg (4 tablets) today and then 75 mg (1 tablet) once daily thereafter.   *If you need a refill on your cardiac medications before your next appointment, please call your pharmacy*  Lab Work: TODAY: BMET and CBC  Testing/Procedures: Left Heart Cath Your physician has requested that you have a cardiac catheterization. Cardiac catheterization is used to diagnose and/or treat various heart conditions. Doctors may recommend this procedure for a number of different reasons. The most common reason is to evaluate chest pain. Chest pain can be a symptom of coronary artery disease (CAD), and cardiac catheterization can show whether plaque is narrowing or blocking your heart's arteries. This procedure is also used to evaluate the valves, as well as measure the blood flow and oxygen levels in different parts of your heart. For further information please visit [email or web address] Please follow instruction sheet, as given.  Follow-Up: At [facility], you and your health needs are our priority.  As part of our continuing mission to provide you with exceptional heart care, our providers are all part of one team.  This team includes your primary Cardiologist (physician) and Advanced Practice Providers or APPs (Physician Assistants and Nurse Practitioners) who all work together to provide you with the care you need, when you need it.

## 2024-10-11 NOTE — Assessment & Plan Note (Signed)
 Patient with worsening symptoms of angina.  Shortness of breath has been his anginal equivalent in the past and he also has exertional chest discomfort.  The patient is on carvedilol and amlodipine for antianginal therapy.  I have recommended that he start clopidogrel  300 mg x 1 today then 75 mg daily tomorrow.  His symptoms are highly suspicious for recurrent coronary stenosis.  I recommended repeat cardiac catheterization via femoral approach. I have reviewed the risks, indications, and alternatives to cardiac catheterization, possible angioplasty, and stenting with the patient. Risks include but are not limited to bleeding, infection, vascular injury, stroke, myocardial infection, arrhythmia, kidney injury, radiation-related injury in the case of prolonged fluoroscopy use, emergency cardiac surgery, and death. The patient understands the risks of serious complication is 1-2 in 1000 with diagnostic cardiac cath and 1-2% or less with angioplasty/stenting.

## 2024-10-11 NOTE — Assessment & Plan Note (Signed)
 Treated with atorvastatin  80 mg daily.

## 2024-10-11 NOTE — Assessment & Plan Note (Signed)
 Blood pressure is controlled on amlodipine, carvedilol, and losartan.  Continue the same.

## 2024-10-12 ENCOUNTER — Telehealth: Payer: Self-pay | Admitting: *Deleted

## 2024-10-12 LAB — BASIC METABOLIC PANEL WITH GFR
BUN/Creatinine Ratio: 11 (ref 10–24)
BUN: 11 mg/dL (ref 8–27)
CO2: 26 mmol/L (ref 20–29)
Calcium: 9.2 mg/dL (ref 8.6–10.2)
Chloride: 102 mmol/L (ref 96–106)
Creatinine, Ser: 1.02 mg/dL (ref 0.76–1.27)
Glucose: 92 mg/dL (ref 70–99)
Potassium: 4.3 mmol/L (ref 3.5–5.2)
Sodium: 140 mmol/L (ref 134–144)
eGFR: 74 mL/min/1.73 (ref >59)

## 2024-10-12 LAB — CBC
Hematocrit: 45.4 % (ref 37.5–51.0)
Hemoglobin: 15.1 g/dL (ref 13.0–17.7)
MCH: 30 pg (ref 26.6–33.0)
MCHC: 33.3 g/dL (ref 31.5–35.7)
MCV: 90 fL (ref 79–97)
Platelets: 219 x10E3/uL (ref 150–450)
RBC: 5.03 x10E6/uL (ref 4.14–5.80)
RDW: 13.9 % (ref 11.6–15.4)
WBC: 8.5 x10E3/uL (ref 3.4–10.8)

## 2024-10-12 NOTE — Telephone Encounter (Signed)
 Cardiac Catheterization scheduled at [facility] for: Thursday October 14, 2024 9 AM Arrival time [facility] Main Entrance A at: 7 AM  Diet: -Nothing to eat after midnight.  Hydration: -May drink clear liquids until 2 hours before the procedure.    Approved liquids: Water, clear tea, black coffee, fruit juices-non-citric and without pulp,Gatorade, plain Jello/popsicles.   -Please drink 16 oz of water 2 hours before procedure.  Medication instructions: -Usual morning medications can be taken including aspirin  81 mg and Plavix  75 mg  Plan to go home the same day, you will only stay overnight if medically necessary.  You must have responsible adult to drive you home.  Someone must be with you the first 24 hours after you arrive home.  Reviewed procedure instructions with patient.

## 2024-10-14 ENCOUNTER — Ambulatory Visit (HOSPITAL_COMMUNITY)
Admission: RE | Admit: 2024-10-14 | Discharge: 2024-10-14 | Disposition: A | Discharge status: Home / Self Care | Attending: Cardiovascular Disease | Admitting: Cardiovascular Disease

## 2024-10-14 ENCOUNTER — Other Ambulatory Visit: Payer: Self-pay

## 2024-10-14 ENCOUNTER — Encounter (HOSPITAL_COMMUNITY): Payer: Self-pay | Admitting: Cardiovascular Disease

## 2024-10-14 ENCOUNTER — Ambulatory Visit (HOSPITAL_COMMUNITY)

## 2024-10-14 ENCOUNTER — Encounter (HOSPITAL_COMMUNITY)
Admission: RE | Disposition: A | Discharge status: Home / Self Care | Payer: Self-pay | Source: Home / Self Care | Attending: Cardiovascular Disease

## 2024-10-14 DIAGNOSIS — E782 Mixed hyperlipidemia: Secondary | ICD-10-CM | POA: Diagnosis not present

## 2024-10-14 DIAGNOSIS — Z955 Presence of coronary angioplasty implant and graft: Secondary | ICD-10-CM | POA: Diagnosis not present

## 2024-10-14 DIAGNOSIS — I350 Nonrheumatic aortic (valve) stenosis: Secondary | ICD-10-CM | POA: Diagnosis not present

## 2024-10-14 DIAGNOSIS — I2510 Atherosclerotic heart disease of native coronary artery without angina pectoris: Secondary | ICD-10-CM | POA: Diagnosis present

## 2024-10-14 DIAGNOSIS — I2584 Coronary atherosclerosis due to calcified coronary lesion: Secondary | ICD-10-CM | POA: Diagnosis not present

## 2024-10-14 DIAGNOSIS — Z79899 Other long term (current) drug therapy: Secondary | ICD-10-CM | POA: Diagnosis not present

## 2024-10-14 DIAGNOSIS — I25119 Atherosclerotic heart disease of native coronary artery with unspecified angina pectoris: Secondary | ICD-10-CM

## 2024-10-14 DIAGNOSIS — I352 Nonrheumatic aortic (valve) stenosis with insufficiency: Secondary | ICD-10-CM | POA: Diagnosis not present

## 2024-10-14 HISTORY — PX: CORONARY STENT INTERVENTION: CATH118234

## 2024-10-14 HISTORY — PX: CORONARY ANGIOGRAPHY: CATH118303

## 2024-10-14 LAB — ECHOCARDIOGRAM COMPLETE
AR max vel: 0.9 cm2
AV Area VTI: 0.96 cm2
AV Area mean vel: 1 cm2
AV Mean grad: 20 mmHg
AV Peak grad: 36 mmHg
Ao pk vel: 3 m/s
Area-P 1/2: 2.83 cm2
Height: 72 in
S' Lateral: 3.3 cm
Weight: 3712 [oz_av]

## 2024-10-14 LAB — POCT ACTIVATED CLOTTING TIME
Activated Clotting Time: 245 s
Activated Clotting Time: 285 s

## 2024-10-14 SURGERY — CORONARY ANGIOGRAPHY (CATH LAB)
Anesthesia: LOCAL

## 2024-10-14 MED ORDER — FENTANYL CITRATE (PF) 100 MCG/2ML IJ SOLN
INTRAMUSCULAR | Status: DC | PRN
  Administered 2024-10-14 (×2): 25 ug via INTRAVENOUS

## 2024-10-14 MED ORDER — MIDAZOLAM HCL 2 MG/2ML IJ SOLN
INTRAMUSCULAR | Status: AC
  Filled 2024-10-14: qty 2

## 2024-10-14 MED ORDER — HEPARIN SODIUM (PORCINE) 1000 UNIT/ML IJ SOLN
INTRAMUSCULAR | Status: AC
  Filled 2024-10-14: qty 10

## 2024-10-14 MED ORDER — MIDAZOLAM HCL (PF) 2 MG/2ML IJ SOLN
INTRAMUSCULAR | Status: DC | PRN
Start: 2024-10-14 — End: 2024-10-14
  Administered 2024-10-14: 2 mg via INTRAVENOUS
  Administered 2024-10-14: 1 mg via INTRAVENOUS

## 2024-10-14 MED ORDER — SODIUM CHLORIDE 0.9 % IV SOLN: 250.0000 mL | INTRAVENOUS | Status: DC | PRN

## 2024-10-14 MED ORDER — IOHEXOL 350 MG/ML SOLN
INTRAVENOUS | Status: DC | PRN
  Administered 2024-10-14: 115 mL

## 2024-10-14 MED ORDER — LIDOCAINE HCL (PF) 1 % IJ SOLN
INTRAMUSCULAR | Status: DC | PRN
  Administered 2024-10-14: 2 mL

## 2024-10-14 MED ORDER — SODIUM CHLORIDE 0.9% FLUSH: 3.0000 mL | INTRAVENOUS | Status: DC | PRN

## 2024-10-14 MED ORDER — LABETALOL HCL 5 MG/ML IV SOLN: 10.0000 mg | INTRAVENOUS | Status: DC | PRN

## 2024-10-14 MED ORDER — HYDRALAZINE HCL 20 MG/ML IJ SOLN: 10.0000 mg | INTRAMUSCULAR | Status: DC | PRN

## 2024-10-14 MED ORDER — ONDANSETRON HCL 4 MG/2ML IJ SOLN
4.0000 mg | Freq: Four times a day (QID) | INTRAMUSCULAR | Status: DC | PRN
Start: 2024-10-14 — End: 2024-10-14

## 2024-10-14 MED ORDER — FREE WATER: 500.0000 mL | Freq: Once | Status: DC

## 2024-10-14 MED ORDER — ACETAMINOPHEN 325 MG PO TABS: 650.0000 mg | ORAL_TABLET | ORAL | Status: DC | PRN

## 2024-10-14 MED ORDER — FENTANYL CITRATE (PF) 100 MCG/2ML IJ SOLN
INTRAMUSCULAR | Status: AC
  Filled 2024-10-14: qty 2

## 2024-10-14 MED ORDER — ASPIRIN 81 MG PO CHEW: 81.0000 mg | CHEWABLE_TABLET | ORAL | Status: DC

## 2024-10-14 MED ORDER — HEPARIN SODIUM (PORCINE) 1000 UNIT/ML IJ SOLN
INTRAMUSCULAR | Status: DC | PRN
  Administered 2024-10-14: 10000 [IU] via INTRAVENOUS

## 2024-10-14 MED ORDER — NITROGLYCERIN 1 MG/10 ML FOR IR/CATH LAB
INTRA_ARTERIAL | Status: AC
  Filled 2024-10-14: qty 10

## 2024-10-14 MED ORDER — NITROGLYCERIN 1 MG/10 ML FOR IR/CATH LAB
INTRA_ARTERIAL | Status: DC | PRN
  Administered 2024-10-14: 150 ug via INTRACORONARY

## 2024-10-14 MED ORDER — HEPARIN (PORCINE) IN NACL 1000-0.9 UT/500ML-% IV SOLN
INTRAVENOUS | Status: DC | PRN
  Administered 2024-10-14 (×2): 500 mL

## 2024-10-14 MED ORDER — CLOPIDOGREL BISULFATE 75 MG PO TABS: 75.0000 mg | ORAL_TABLET | ORAL | Status: DC

## 2024-10-14 MED ORDER — LIDOCAINE HCL (PF) 1 % IJ SOLN
INTRAMUSCULAR | Status: AC
  Filled 2024-10-14: qty 30

## 2024-10-14 MED ORDER — SODIUM CHLORIDE 0.9% FLUSH: 3.0000 mL | Freq: Two times a day (BID) | INTRAVENOUS | Status: DC

## 2024-10-14 MED ORDER — ASPIRIN 81 MG PO TBEC: 81.0000 mg | DELAYED_RELEASE_TABLET | Freq: Every day | ORAL | Status: AC

## 2024-10-14 SURGICAL SUPPLY — 19 items
BALLOON EMERGE MR 2.0X15 (BALLOONS) IMPLANT
BALLOON SAPPHIRE NC24 3.0X12 (BALLOONS) IMPLANT
CATH INFINITI 5FR AL1 (CATHETERS) IMPLANT
CATH INFINITI 5FR ANG PIGTAIL (CATHETERS) IMPLANT
CATH INFINITI 5FR JL4 (CATHETERS) IMPLANT
CATH LAUNCHER 6FR AL.75 (CATHETERS) IMPLANT
CLOSURE PERCLOSE PROSTYLE (Vascular Products) IMPLANT
GLIDESHEATH SLEND SS 6F .021 (SHEATH) IMPLANT
GUIDEWIRE INQWIRE 1.5J.035X260 (WIRE) IMPLANT
KIT ENCORE 26 ADVANTAGE (KITS) IMPLANT
KIT MICROPUNCTURE NIT STIFF (SHEATH) IMPLANT
PACK CARDIAC CATHETERIZATION (CUSTOM PROCEDURE TRAY) ×2 IMPLANT
SET ATX-X65L (MISCELLANEOUS) IMPLANT
SHEATH INTRO PINNACLE 6F 25CM (SHEATH) IMPLANT
SHEATH PINNACLE 5F 10CM (SHEATH) IMPLANT
SHEATH PROBE COVER 6X72 (BAG) IMPLANT
STENT SYNERGY XD 2.75X20 (Permanent Stent) IMPLANT
WIRE EMERALD 3MM-J .035X150CM (WIRE) IMPLANT
WIRE RUNTHROUGH IZANAI 014 180 (WIRE) IMPLANT

## 2024-10-14 NOTE — Discharge Summary (Signed)
 Discharge Summary for Same Day PCI   Patient ID: LUISMANUEL Reilly MRN: [id number]; DOB: October 22, 1941  Admit date: 10/14/2024 Discharge date: 10/14/2024  Primary Care Provider: Ina Quintin Charleston, MD  Primary Cardiologist: Ozell Fell, MD   Primary Electrophysiologist:  None   Discharge Diagnoses    Principal Problem:   CORONARY ARTERY DISEASE    Diagnostic Studies/Procedures    Cardiac Catheterization 10/14/2024:  1.  Severe distal RCA stenosis (culprit lesion) treated successfully with PCI using a 2.75 x 20 mm Synergy DES, reducing 95% stenosis to 0% post PCI 2.  Stable left-sided coronary artery disease with 50% moderate distal left main stenosis, nonobstructive LAD plaquing, moderate 60 to 65% ramus intermedius stenosis, and chronic occlusion of the left circumflex supplied by left to left collaterals from the apical LAD. 3.  Unable to cross the aortic valve, suspect at least moderate aortic stenosis   Recommendations: Same-day PCI protocol.  Check 2D echocardiogram to assess for aortic stenosis and LV function, DAPT with aspirin  and clopidogrel  continue minimum of 6 months without interruption but favor long-term P2 Y12 inhibition in this patient with multiple plaque ruptures requiring multiple stent procedures.  Diagnostic Dominance: Right  Intervention    Echocardiogram 10/14/24  1. Left ventricular ejection fraction, by estimation, is 60 to 65%. The  left ventricle has normal function. The left ventricle has no regional  wall motion abnormalities. There is mild left ventricular hypertrophy.  Left ventricular diastolic parameters  are consistent with Grade I diastolic dysfunction (impaired relaxation).   2. Right ventricular systolic function is normal. The right ventricular  size is normal. There is normal pulmonary artery systolic pressure. The  estimated right ventricular systolic pressure is 27.6 mmHg.   3. The mitral valve is grossly normal. Trivial  mitral valve  regurgitation. No evidence of mitral stenosis.   4. The aortic valve is abnormal. There is moderate-severe calcification  of the aortic valve. Aortic valve regurgitation is not visualized.  Moderate aortic valve stenosis. Aortic valve area, by VTI measures 0.96  cm. Aortic valve mean gradient measures  20.0 mmHg. Aortic valve Vmax measures 3.00 m/s. SVI is low at 28 however  this may be secondary to suboptimal measurement of LVOTd. DVI is 0.31,  which seems consistent with moderate aortic valve stenosis. Consider  follow up echocardiogram in 6-12 months  depending on symptoms.   5. The inferior vena cava is normal in size with greater than 50%  respiratory variability, suggesting right atrial pressure of 3 mmHg.  _____________   History of Present Illness     RICKARDO Reilly is a 83 y.o. male with a past medical history of CAD. Patient had previously had unstable angina in 2006 and was treated with stenting of the right coronary artery and left circumflex. He presented with an inferior STEMI in 2017 and was found to have total occlusion of the mid RCA with high-grade proximal RCA obstruction that was treated with overlapping drug-eluting stents. The circumflex was chronically occluded. There was mild to moderate distal left main stenosis, moderate stenosis of the ramus intermedius, and patency of the LAD. He has been managed medically since that time. He developed recurrent chest pain and underwent cardiac catheterization again showing severe stenosis of the distal RCA. The patient was told he might need multivessel CABG but ultimately was managed medically. He came to see me for a second opinion in 2023. At that time I performed a cardiac catheterization and this confirmed moderate 50% distal left main  stenosis, stable over serial cardiac catheterization studies, patency of the LAD with mild diffuse nonobstructive plaquing, chronic occlusion of the AV circumflex and moderate  stenosis of the ramus intermedius branch, both stable again. The RCA had a severe 90% stenosis beyond the stented segment and this was treated successfully with PCI using a 2.75 x 20 mm Synergy DES.   The patient was recently seen by his primary cardiologist at [facility] for symptoms of progressive dyspnea with exertion and fatigue. Recommendations were made for repeat cardiac catheterization. He was seen by Dr. Wonda on 10/11/24. Reported progressive dyspnea over the last 3 months. He also had chest tightness in the center of his chest with exertion.   Cardiac catheterization was arranged for further evaluation.  Hospital Course     The patient underwent cardiac cath as noted above with Dr. Wonda. Had severe distal RCA stenosis that was treated with PCI with DES. Plan for DAPT with ASA/Plavix  for at least 6 months uninterrupted, but recommended long term DAPT. The patient was seen by cardiac rehab while in short stay. There were no observed complications post cath. Femoral cath site was re-evaluated prior to discharge and found to be stable without any complications. Instructions/precautions regarding cath site care were given prior to discharge.  Ethyl MARLA Server was seen by Dr. Wonda and determined stable for discharge home. Follow up with our office has been arranged. Medications are listed below. No changes were made to medication regiment.  Of note, patient follows with a cardiologist in [place], [place] through [facility]. Patient will arrange follow up with his primary cardiologist. Reviewed importance of DAPT   We discussed his echocardiogram that showed EF 60-65%, grade I DD, normal RV systolic function, moderate  aortic valve stenosis. Recommended repeat echo in 6-12 months. _____________  Cath/PCI Registry Performance & Quality Measures: Aspirin  prescribed? - Yes ADP Receptor Inhibitor (Plavix /Clopidogrel , Brilinta /Ticagrelor  or Effient/Prasugrel) prescribed  (includes medically managed patients)? - Yes High Intensity Statin (Lipitor  40-80mg  or Crestor 20-40mg ) prescribed? - Yes For EF <40%, was ACEI/ARB prescribed? - Not Applicable (EF >/= 40%) For EF <40%, Aldosterone Antagonist (Spironolactone or Eplerenone) prescribed? - Not Applicable (EF >/= 40%) Cardiac Rehab Phase II ordered (Included Medically managed Patients)? - No - patient lives in [place]    _____________   Discharge Vitals Blood pressure (!) 148/73, pulse 79, temperature 98.3 F (36.8 C), resp. rate 13, height 6' (1.829 m), weight 105.2 kg, SpO2 97%.  Filed Weights   10/14/24 0833  Weight: 105.2 kg    Last Labs & Radiologic Studies    CBC Recent Labs    10/11/24 1512  WBC 8.5  HGB 15.1  HCT 45.4  MCV 90  PLT 219   Basic Metabolic Panel Recent Labs    88/82/74 1512  NA 140  K 4.3  CL 102  CO2 26  GLUCOSE 92  BUN 11  CREATININE 1.02  CALCIUM  9.2   Liver Function Tests No results for input(s): AST, ALT, ALKPHOS, BILITOT, PROT, ALBUMIN in the last 72 hours. No results for input(s): LIPASE, AMYLASE in the last 72 hours. High Sensitivity Troponin:   No results for input(s): TROPONINIHS in the last 720 hours.  BNP Invalid input(s): POCBNP D-Dimer No results for input(s): DDIMER in the last 72 hours. Hemoglobin A1C No results for input(s): HGBA1C in the last 72 hours. Fasting Lipid Panel No results for input(s): CHOL, HDL, LDLCALC, TRIG, CHOLHDL, LDLDIRECT in the last 72 hours. Thyroid Function Tests No results  for input(s): TSH, T4TOTAL, T3FREE, THYROIDAB in the last 72 hours.  Invalid input(s): FREET3 _____________  ECHOCARDIOGRAM COMPLETE Result Date: 10/14/2024    ECHOCARDIOGRAM REPORT   Patient Name:   Nicholas Reilly Date of Exam: 10/14/2024 Medical Rec #:  [id number]         Height:       72.0 in Accession #:    [id number]        Weight:       232.0 lb Date of Birth:  1941/04/21        BSA:           2.269 m Patient Age:    82 years          BP:           144/81 mmHg Patient Gender: M                 HR:           72 bpm. Exam Location:  Inpatient Procedure: 2D Echo, Cardiac Doppler, Color Doppler and PEDOF (Both Spectral and            Color Flow Doppler were utilized during procedure). Indications:    Aortic Stenosis I35.0  History:        Patient has prior history of Echocardiogram examinations, most                 recent 08/26/2022. Hx of STEMI and CAD, Aortic Valve Disease;                 Risk Factors:Former Smoker, Hypertension, Dyslipidemia and Sleep                 Apnea.  Sonographer:    Koleen Popper RDCS Referring Phys: OZELL FELL  Sonographer Comments: Suboptimal parasternal window. Image acquisition challenging due to respiratory motion and Image acquisition challenging due to patient body habitus. IMPRESSIONS  1. Left ventricular ejection fraction, by estimation, is 60 to 65%. The left ventricle has normal function. The left ventricle has no regional wall motion abnormalities. There is mild left ventricular hypertrophy. Left ventricular diastolic parameters are consistent with Grade I diastolic dysfunction (impaired relaxation).  2. Right ventricular systolic function is normal. The right ventricular size is normal. There is normal pulmonary artery systolic pressure. The estimated right ventricular systolic pressure is 27.6 mmHg.  3. The mitral valve is grossly normal. Trivial mitral valve regurgitation. No evidence of mitral stenosis.  4. The aortic valve is abnormal. There is moderate-severe calcification of the aortic valve. Aortic valve regurgitation is not visualized. Moderate aortic valve stenosis. Aortic valve area, by VTI measures 0.96 cm. Aortic valve mean gradient measures 20.0 mmHg. Aortic valve Vmax measures 3.00 m/s. SVI is low at 28 however this may be secondary to suboptimal measurement of LVOTd. DVI is 0.31, which seems consistent with moderate aortic valve stenosis.  Consider follow up echocardiogram in 6-12 months depending on symptoms.  5. The inferior vena cava is normal in size with greater than 50% respiratory variability, suggesting right atrial pressure of 3 mmHg. FINDINGS  Left Ventricle: Left ventricular ejection fraction, by estimation, is 60 to 65%. The left ventricle has normal function. The left ventricle has no regional wall motion abnormalities. The left ventricular internal cavity size was normal in size. There is  mild left ventricular hypertrophy. Left ventricular diastolic parameters are consistent with Grade I diastolic dysfunction (impaired relaxation). Right Ventricle: The right ventricular size is normal. No increase in right ventricular  wall thickness. Right ventricular systolic function is normal. There is normal pulmonary artery systolic pressure. The tricuspid regurgitant velocity is 2.48 m/s, and  with an assumed right atrial pressure of 3 mmHg, the estimated right ventricular systolic pressure is 27.6 mmHg. Left Atrium: Left atrial size was normal in size. Right Atrium: Right atrial size was normal in size. Pericardium: There is no evidence of pericardial effusion. Presence of epicardial fat layer. Mitral Valve: The mitral valve is grossly normal. Trivial mitral valve regurgitation. No evidence of mitral valve stenosis. Tricuspid Valve: The tricuspid valve is normal in structure. Tricuspid valve regurgitation is trivial. No evidence of tricuspid stenosis. Aortic Valve: The aortic valve is abnormal. There is moderate-severe calcification of the aortic valve. Aortic valve regurgitation is not visualized. Moderate aortic stenosis is present. Aortic valve mean gradient measures 20.0 mmHg. Aortic valve peak gradient measures 36.0 mmHg. Aortic valve area, by VTI measures 0.96 cm. Pulmonic Valve: The pulmonic valve was not well visualized. Pulmonic valve regurgitation is not visualized. No evidence of pulmonic stenosis. Aorta: The aortic root is normal in  size and structure. Venous: The inferior vena cava is normal in size with greater than 50% respiratory variability, suggesting right atrial pressure of 3 mmHg. IAS/Shunts: The atrial septum is grossly normal.  LEFT VENTRICLE PLAX 2D LVIDd:         4.30 cm   Diastology LVIDs:         3.30 cm   LV e' medial:    6.20 cm/s LV PW:         1.10 cm   LV E/e' medial:  13.2 LV IVS:        1.20 cm   LV e' lateral:   9.25 cm/s LVOT diam:     2.00 cm   LV E/e' lateral: 8.8 LV SV:         63 LV SV Index:   28 LVOT Area:     3.14 cm  RIGHT VENTRICLE             IVC RV S prime:     11.50 cm/s  IVC diam: 1.70 cm TAPSE (M-mode): 2.4 cm LEFT ATRIUM             Index LA diam:        4.20 cm 1.85 cm/m LA Vol (A2C):   33.8 ml 14.89 ml/m LA Vol (A4C):   46.8 ml 20.62 ml/m LA Biplane Vol: 40.0 ml 17.63 ml/m  AORTIC VALVE AV Area (Vmax):    0.90 cm AV Area (Vmean):   1.00 cm AV Area (VTI):     0.96 cm AV Vmax:           300.00 cm/s AV Vmean:          188.667 cm/s AV VTI:            0.659 m AV Peak Grad:      36.0 mmHg AV Mean Grad:      20.0 mmHg LVOT Vmax:         86.30 cm/s LVOT Vmean:        59.950 cm/s LVOT VTI:          0.202 m LVOT/AV VTI ratio: 0.31  AORTA Ao Root diam: 3.40 cm MITRAL VALVE                TRICUSPID VALVE MV Area (PHT): 2.83 cm     TR Peak grad:   24.6 mmHg MV Decel Time: 268 msec  TR Vmax:        248.00 cm/s MV E velocity: 81.80 cm/s MV A velocity: 143.00 cm/s  SHUNTS MV E/A ratio:  0.57         Systemic VTI:  0.20 m                             Systemic Diam: 2.00 cm Soyla Merck MD Electronically signed by Soyla Merck MD Signature Date/Time: 10/14/2024/12:44:03 PM    Final    CARDIAC CATHETERIZATION Result Date: 10/14/2024 1.  Severe distal RCA stenosis (culprit lesion) treated successfully with PCI using a 2.75 x 20 mm Synergy DES, reducing 95% stenosis to 0% post PCI 2.  Stable left-sided coronary artery disease with 50% moderate distal left main stenosis, nonobstructive LAD plaquing,  moderate 60 to 65% ramus intermedius stenosis, and chronic occlusion of the left circumflex supplied by left to left collaterals from the apical LAD. 3.  Unable to cross the aortic valve, suspect at least moderate aortic stenosis Recommendations: Same-day PCI protocol.  Check 2D echocardiogram to assess for aortic stenosis and LV function, DAPT with aspirin  and clopidogrel  continue minimum of 6 months without interruption but favor long-term P2 Y12 inhibition in this patient with multiple plaque ruptures requiring multiple stent procedures.    Disposition   Pt is being discharged home today in good condition.  Follow-up Plans & Appointments     Discharge Instructions     AMB Referral to Cardiac Rehabilitation - Phase II   Complete by: As directed    Diagnosis: Coronary Stents   After initial evaluation and assessments completed: Virtual Based Care may be provided alone or in conjunction with Phase 2 Cardiac Rehab based on patient barriers.: Yes   Intensive Cardiac Rehabilitation (ICR) [facility] location only OR Traditional Cardiac Rehabilitation (TCR) *If criteria for ICR are not met will enroll in TCR [facility] only): Yes        Discharge Medications   Allergies as of 10/14/2024       Reactions   Doxycycline Nausea And Vomiting   Other Other (See Comments)   Several foods especially spicy foods cause congestion        Medication List     TAKE these medications    amLODipine 5 MG tablet Commonly known as: NORVASC Take 5 mg by mouth daily.   aspirin  EC 81 MG tablet Take 1 tablet (81 mg total) by mouth daily. Swallow whole. What changed: when to take this   atorvastatin  80 MG tablet Commonly known as: LIPITOR  Take 80 mg by mouth daily.   carvedilol 3.125 MG tablet Commonly known as: COREG Take 3.125 mg by mouth 2 (two) times daily with a meal.   clopidogrel  75 MG tablet Commonly known as: PLAVIX  Take 1 tablet (75 mg total) by mouth daily.   diphenhydrAMINE  25 MG  tablet Commonly known as: BENADRYL  Take 25 mg by mouth daily as needed for allergies.   FLUoxetine 20 MG capsule Commonly known as: PROZAC Take 20 mg by mouth daily as needed (Sweet Pill).   gabapentin 300 MG capsule Commonly known as: NEURONTIN Take 300 mg by mouth 3 (three) times daily as needed (pain).   losartan 100 MG tablet Commonly known as: COZAAR Take 100 mg by mouth daily.   meloxicam 7.5 MG tablet Commonly known as: MOBIC Take 7.5 mg by mouth daily.   Multivitamin Adult Chew Chew 2 each by mouth daily.   nitroGLYCERIN  0.4 MG  SL tablet Commonly known as: NITROSTAT  Place 0.4 mg under the tongue every 5 (five) minutes as needed for chest pain.   pantoprazole  40 MG tablet Commonly known as: PROTONIX  Take 40 mg by mouth daily. What changed:  when to take this reasons to take this   Systane 0.4-0.3 % Soln Generic drug: Polyethyl Glycol-Propyl Glycol Place 1 drop into both eyes as needed (Dry eyes).           Allergies Allergies  Allergen Reactions   Doxycycline Nausea And Vomiting   Other Other (See Comments)    Several foods especially spicy foods cause congestion    Outstanding Labs/Studies    Duration of Discharge Encounter   Greater than 30 minutes including physician time.  Signed, Rollo FABIENE Louder, PA-C 10/14/2024, 1:36 PM

## 2024-10-14 NOTE — Progress Notes (Signed)
 Patient and son was given discharge instructions. Both verbalized understanding.

## 2024-10-14 NOTE — Progress Notes (Signed)
 CARDIAC REHAB PHASE I     Post stent education including site care, restrictions, risk factors, exercise guidelines, NTG use, antiplatelet therapy importance, heart healthy diet and CRP2 reviewed. All questions and concerns addressed. Will refer to [facility] for CRP2. Plan for home later today.     Vaughn Asberry Hacking, RN BSN 10/14/2024 2:28 PM

## 2024-10-14 NOTE — Progress Notes (Signed)
  Echocardiogram 2D Echocardiogram has been performed.  Koleen KANDICE Popper, RDCS 10/14/2024, 11:38 AM

## 2024-10-14 NOTE — Interval H&P Note (Signed)
 History and Physical Interval Note:  10/14/2024 8:51 AM  Nicholas Reilly  has presented today for surgery, with the diagnosis of cad.  The various methods of treatment have been discussed with the patient and family. After consideration of risks, benefits and other options for treatment, the patient has consented to  Procedure(s): LEFT HEART CATH AND CORONARY ANGIOGRAPHY (N/A) as a surgical intervention.  The patient's history has been reviewed, patient examined, no change in status, stable for surgery.  I have reviewed the patient's chart and labs.  Questions were answered to the patient's satisfaction.     Ozell Fell

## 2024-10-20 ENCOUNTER — Telehealth (HOSPITAL_COMMUNITY): Payer: Self-pay

## 2024-10-20 NOTE — Telephone Encounter (Signed)
 Faxed outside referral for Phase II Cardiac Rehab to [facility].

## 2024-10-24 ENCOUNTER — Ambulatory Visit: Payer: Self-pay | Admitting: Cardiovascular Disease

## 2024-10-29 NOTE — Progress Notes (Signed)
 CARDIOLOGY OFFICE FOLLOW-UP NOTE    Subjective Nicholas Reilly is a 83 y.o. male. Followed by Nicholas Reilly and was last seen in office by Nicholas Fleeta Nation, NP. Past medical history of coronary artery disease, multiple PCI/DES procedures, ischemic cardiomyopathy,    - June 2016 underwent cardiac catheterization in [facility]  this resulted in placement of a 3.0 x 28 mm Cypher drug-eluting stent to the RCA and a 3.0 x 23 mm Cypher drug-eluting stent to the left circumflex.     - February 2023 he had recurrent chest discomfort. He was seen back in [place] and underwent repeat diagnostic angiography. This led to placement of 2 drug-eluting stents. He had a 3.5 x 16 mm [employer] Scientific stent placed in the distal PDA and a 4.0 x 16 mm stent placed in the RCA..   - 08/2022-status post diagnostic heart catheterization, PCI of the distal RCA 90% stenosis treated with a 2.75 x 20 mm Synergy DES at [facility] in [place] .    He was seen at [facility] ED in October 2024 due to syncope, likely due to hypotension with viagra and home BP meds. Viagra was stopped. Pt states he also chose to stop his losartan. BP in office remains elevated at 160's systolic. Notably on EKG in office, he was in ventricular Bigeminy. He states the day prior, he went to a medical spa and sat in a type of sauna, which has left him dehydrated. He is asymptomatic.    Patient had an abnormal EKG, repeated in office showing frequent PVC's, not bigeminy. Recent labs with stable potassium and kidney function. Given the plan for orthopedic surgery, he underwent nuclear stress test 12/11/2023 that was negative for ischemia but abnormal due to reduced EF on gated study.  Left ventricular systolic function mildly to moderately reduced with EF 40%.  There is evidence of mild to moderate global hypokinesis in the left ventricle.  There were occasional PVCs on both stress and recovery EKG.  He complains of  fatigue, feels exhausted despite adequate sleep at night.  He does not typically have shortness of breath or fatigue making him concerned about a recurrent coronary blockage.  He denies chest pain or pressure, orthopnea, edema.  Denies tachypalpitations.   I reviewed his latest nuclear stress test results from 12/11/2023 that showed no ischemia however his systolic function was mildly to moderately reduced with EF 40% as well as evidence of mild to moderate global hypokinesis in the left ventricle.  An echocardiogram performed prior on 09/01/2023 showed normal wall motion with an EF of 50 to 55%.  Today patient presents for hospital follow-up status post same-day admission to [facility] on 10/14/2024.  He underwent a diagnostic heart catheterization for complaints of 30-month duration of progressive shortness of breath with exertion, fatigue, and chest pressure. Heart catheterization 10/14/2024 (Dr. Wonda) showed severe distal RCA stenosis (culprit lesion) treated successfully with PCI using a 2.75 x 20 mm Synergy drug-eluting stent, reducing 95% stenosis to 0% post PCI. Other findings stable left-sided coronary artery disease with 50% moderate distal left main stenosis, nonobstructive LAD plaquing, moderate 60 to 65% ramus intermedius stenosis, and chronic occlusion of the left circumflex supplied by left to left collaterals with apical LAD. Unable to cross aortic valve, suspect at least moderate aortic valve stenosis.  He was initiated on dual antiplatelet therapy with aspirin  and clopidogrel . Echocardiogram showed EF 60 to 65%, left ventricle with no regional wall motion abnormalities.  There  is mild left ventricular hypertrophy.  Grade 1 diastolic dysfunction.  RVSP 27.6 mmHg.  Mitral valve is normal, trivial mitral valve regurgitation.  The aortic valve with moderate to severe calcification of the aortic valve.  Aortic valve regurgitation is not visualized.  Moderate aortic valve  stenosis.  Aortic valve area by VTI measures 0.96 cm.  Aortic valve mean gradient measures 20 mmHg.  Aortic valve V-max measures 3.00 m/s.  SVI is low at 28 however this may be secondary to suboptimal measurement of LVOTd. DVI is 0.31. Patient's losartan was increased to 100 mg daily.  Today patient reports improvement of symptoms.  His main complaint is feeling fatigued all the time.  States he can sleep 12 hours a night and still feels fatigued.   He does endorse a history of obstructive sleep apnea.  He has not used his CPAP machine in years. Denies chest pain, shortness of breath, orthopnea, edema.  Denies palpitations, lightheadedness, dizziness. We reviewed his medication reconciliation carefully.  He is taking aspirin  and clopidogrel  as prescribed.  He is uncertain whether he increased his losartan from 50 to 100 mg daily as prescribed He is interested in starting cardiac rehab at [facility] I have personally reviewed the past medical history, family history, social history and review of systems with the visit today and made changes accordingly.   Cardiac Diagnostic Studies: Catheterization 09/13/2022: [place]: RPAV lesion is 50% stenosed. LM lesion is 40% stenosed. Prox Cx to Mid Cx lesion is 100% stenosed. Prox RCA to Mid RCA lesion is 25% stenosed. Mid RCA to Dist RCA lesion is 90% stenosed. Mid LM to Dist LM lesion is 50% stenosed. Ramus lesion is 65% stenosed. A drug-eluting stent was successfully placed using a SYNERGY XD 2.75X20. Post intervention, there is a 0% residual stenosis. Continue aspirin  and clopidogrel  without interruption at least 6 months. Consider long-term DAPT with history of extensive stenting, history of STEMI, and recurrent ischemic episodes.  Same-day PCI protocol if criteria met.   08/26/2022: [facility]: Subtotally occluded distal RCA. Chronic total occlusion of the proximal Lcx. Obstructive mid RI disease. Moderate distal LM disease.  Minimal, nonobstructive LAD disease. Recommendations: Initial recommendation is optimization of medical therapy.  If the patient is felt to be symptomatic refractory to medical therapy, complex multivessel PCI may be entertained.   CT/CTA 04/18/2017: [facility]: CTA Chest: No acute intrathoracic abnormalities or evidence of pulmonary embolism. Emphysematous changes. Extensive coronary artery calcifications.    Echocardiogram 09/01/2023:  Left Ventricle: Size is normal. Findings consistent with mild to moderate left ventricular concentric hypertrophy. Normal wall motion. Low normal LV systolic function with an approximate EF of 50 - 55%. Grade I: There is impaired left ventricular relaxation.  Right Ventricle: Size is normal. Normal systolic function.  Left Atrium: Size is normal.  Right Atrium: Size is normal.  Atrial Septum: No intracardiac shunt detected by aerated saline bubble study.  Aortic Valve: Trileaflet. Leaflet excursion is reduced. Moderately calcified cusps. Mild transvalvular regurgitation. AV pressure half-time is 437.00 ms. Mild to moderate stenosis. AV mean gradient is 18 mmHg. AV peak gradient is 33.0 mmHg. AV area by continuity VTI is 1.32 cm2. AV stenosis dimensionless index is 0.4 (reference range, severe: <0.25).  Mitral Valve: Mildly calcified leaflets. Mild mitral annular calcification. Trivial regurgitation. No stenosis.  Tricuspid Valve: Normal valve structure. There is no pulmonary hypertension. RVSP is 29.0 mmHg. Trivial regurgitation. No stenosis.  Pulmonic Valve: Normal valve structure. No regurgitation. No stenosis.  Extracardiac: No  pericardial effusion.  Aorta: Normal sized ascending aorta.  IVC/SVC: IVC diameter is less than or equal to 21 mm and decreases greater than 50% during inspiration; therefore the estimated right atrial pressure is normal (~3 mmHg).   08/26/2022: Left Ventricle: Size is normal. Mildly increased wall thickness. Suboptimal imaging, unable  to exclude regional wall motion abnormalities. Low normal systolic function with an approximate EF of 50 - 55%. Grade I: There is impaired left ventricular relaxation. Aortic Valve: Mild stenosis. AV mean gradient is 12.0 mmHg. AV peak gradient is 21.0 mmHg. AV area by continuity VTI is 1.6 cm2. AV stenosis dimensionless index is 0.5 (reference range, severe: <0.25). Mitral Valve: Mild mitral annular calcification. A complete 2D, color flow doppler and spectral doppler echocardiogram was performed.   01/25/2021: LV chamber size is normal. LV wall thickness is moderately increased. LV systolic function is normal. The estimated left ventricle ejection fraction is 55-60% (normal). There is borderline left atrial enlargement. LA diameter is 4 cm. There is aortic valve sclerosis with reduced leaflet excursion. There is mild aortic regurgitation. There is mild aortic valve stenosis. There is mild mitral annular calcification. There is trace mitral regurgitation. The estimated RV systolic pressure was not measured in the absence of an adequate TR jet. The size of the visualized portion of aortic root is within normal limits.  11/11/2014: Normal left ventricular size and systolic function. LVEF (normal): 55-60%. Mild, grade 1 diastolic dysfunction. Mild left atrial enlargement. No hemodynamically significant valvular disease. There are no prior studies for comparison.    Event Monitor 12/05/2014 - 12/18/2014: Essentially Normal Life Watch Monitor With Rare PACs But No Significant Tachy Or Brady-Arrhythmia.    Nuclear Stress Test  12/11/2023 The study was negative for ischemia but abnormal due to reduced EF on gated study. Stress images appear normal. On resting images, no ischemia or infarction detected. Stress test is limited due to significant patient motion. There is no evidence of transient ischemic dilation (TID), the value is 0.82. Stress images appear normal. On resting images, no ischemia or infarction  detected. Stress test is limited due to significant patient motion. There is no evidence of transient ischemic dilation (TID), the value is 0.82. The patient exercised for 4 min and 1 sec and had a maximal HR of 107 bpm (% of MPHR) 1.0 METS. Baseline HR: 81 bpm. Peak HR: 107 bpm. Target HR: 119 bpm. Resting BP: 146/82 mmHg. Peak BP: 146/82 mmHg. Recovery BP: 146/80 mmHg. Low level exercise was used during the pharmacological stress test. The patient reported no symptoms during the stress test. Blood pressure demonstrated a normal response and heart rate demonstrated a normal response to stress. The patient's heart rate recovery was normal. The stress test was stopped because protocol was complete. The patient exercised for 4 min and 1 sec and had a maximal HR of 107 bpm (% of MPHR) 1.0 METS. Baseline HR: 81 bpm. Peak HR: 107 bpm. Target HR: 119 bpm. Resting BP: 146/82 mmHg. Peak BP: 146/82 mmHg. Recovery BP: 146/80 mmHg. Low level exercise was used during the pharmacological stress test. The patient reported no symptoms during the stress test. Blood pressure demonstrated a normal response and heart rate demonstrated a normal response to stress. The patient's heart rate recovery was normal. The stress test was stopped because protocol was complete.  The patient exercised for 4 min and 1 sec and had a maximal HR of 107 bpm (% of MPHR) 1.0 METS. Baseline HR: 81 bpm. Peak HR: 107 bpm.  Target HR: 119 bpm. Resting BP: 146/82 mmHg. Peak BP: 146/82 mmHg. Recovery BP: 146/80 mmHg. L The patient's heart rate recovery was normal. The stress test was stopped because protocol was complete.    Stress Test 08/20/2022: A Bruce protocol stress test was performed. Overall, the patient's exercise capacity was fair for their age. The patient and had a maximal HR of 131 bpm (% of MPHR) METS. The patient reported fatigue during the stress test. Blood pressure demonstrated a normal response to stress. Stress ECG Findings: No  significant ST deviation was noted. Arrhythmias during stress: rare premature ventricular contractions. These studies are limited by marked patient motion. Rest images were repeated but patient declined repeat stress images. Interpretation may be affected by significant patient motion.  Myocardial perfusion imaging shows moderate size moderate to severe defect in the inferolateral region.  This is seen more so on stress images.  The findings are consistent with inferolateral scar with mild to moderate peri-infarct ischemia extending into the lateral region. The calculated left ventricular ejection fraction is 46% with inferolateral wall motion abnormality.   05/03/2020: Exercise stress nuclear study is normal. Normal SPECT perfusion imaging with artifact: diaphragmatic attenuation. Non-diagnostic due to pre-existing ST changes. Normal left ventricular systolic function. The calculated ejection fraction is 76%. Exercise tolerance was fair. Heart rate response was adequate. Blood pressure response was normal. Compared to the prior study on 11/15/2014, previously noted inferolateral ischemia was not seen on this study.  11/15/2014: [facility]: Abnormal stress myoview study with reversible inferolateral ischemia with associated ST changes. Preserved systolic function, E-F 55%. Please correlate clinically.    Vascular Study  Lower Ext Duplex 05/17/2024: No sonographic evidence of deep venous thrombosis of the left lower extremity.  09/30/2016: Carotid: No hemodynamically significant arterial disease in the internal carotid artery bilaterally. The left external carotid artery demonstrates a greater than 50% stenosis. Patent right vertebral artery with high resistive, antegrade flow. Suggestive of a distal stenosis or occlusion. Patent left vertebral artery with antegrade flow.   Chief Complaint: Follow-up, Coronary Artery Disease, Hypertension, Hyperlipidemia, PVC (premature ventricular contraction)  , Shortness of Breath, and Cardiomyopathy  Current Medications[1]   Medical History[2]  Allergies[3]   Surgical History[4]   Family History[5]   Social History[6]   ROS General: No weight change.  No fever chills Respiratory: No cough.  No shortness of breath.  No dyspnea on exertion.  No PND orthopnea. Cardio: No chest pain.  No palpitations. Hematology: No abnormal bleeding. No nosebleed. Genitourinary: No hematuria no dysuria.  Extremities: No peripheral edema Neurology: No dizziness lightheadedness.  No headache  Visit Vitals BP 148/88  Pulse 87  Ht 6' (1.829 m)  Wt 108 kg (237 lb)  BMI 32.14 kg/m  Smoking Status Former  BSA 2.34 m    BP Readings from Last 5 Encounters:  11/01/24 148/88  10/06/24 138/70  09/28/24 163/74  06/22/24 163/82  05/21/24 120/67   Pulse Readings from Last 5 Encounters:  11/01/24 87  10/06/24 94  09/28/24 99  06/22/24 65  05/21/24 109   Wt Readings from Last 5 Encounters:  11/01/24 108 kg (237 lb)  10/06/24 105 kg (232 lb)  09/28/24 106 kg (234 lb 9.6 oz)  07/07/24 101 kg (222 lb)  06/22/24 98 kg (216 lb)     Objective Physical Exam Vitals reviewed.  Constitutional:      General: He is not in acute distress.    Appearance: Normal appearance.  Eyes:     Pupils: Pupils  are equal, round, and reactive to light.  Cardiovascular:     Rate and Rhythm: Normal rate and regular rhythm.     Pulses: Normal pulses.     Heart sounds: Normal heart sounds. No murmur heard.    No friction rub.  Pulmonary:     Effort: Pulmonary effort is normal.     Breath sounds: Normal breath sounds.  Musculoskeletal:     Right lower leg: No edema.     Left lower leg: No edema.  Skin:    General: Skin is warm and dry.  Neurological:     General: No focal deficit present.     Mental Status: He is alert and oriented to person, place, and time.     Encounter Date: 11/01/24  ECG 12 lead (clinic-performed)   Narrative   Sinus Rhythm   -Nonspecific T-abnormality.     No components found for: T-28M    Assessment & Plan   Diagnosis ICD-10-CM Plan  1. Coronary artery disease involving native coronary artery of native heart, unspecified whether angina present  I25.10 FOLLOW UP TO [facility]    ECG 12 lead (clinic-performed)    Ambulatory referral to Sleep Medicine    losartan (Cozaar) 100 mg tablet    Transthoracic echo (TTE)    carvedilol (Coreg) 3.125 mg tablet    FOLLOW UP TO [facility]    FOLLOW UP TO [facility]   History of multiple stent procedures, now DES to dRCA 10/14/2024 Continue DAPT 1 year, consider lifelong Continue risk factor modifications Cardiac rehab    2. History of heart artery stent  Z95.5    DES to dRCA 10/14/2024 Continue DAPT uninterrupted for 1 year through 10/14/2025    3. Mixed hyperlipidemia  E78.2 FOLLOW UP TO [facility]    ECG 12 lead (clinic-performed)    Ambulatory referral to Sleep Medicine    losartan (Cozaar) 100 mg tablet    Transthoracic echo (TTE)    carvedilol (Coreg) 3.125 mg tablet    FOLLOW UP TO [facility]    FOLLOW UP TO [facility]   continue statin, labs followed by PCP.  Recommend goal LDL less than 70 Recommend diet and exercise modifications    4. Primary hypertension  I10 FOLLOW UP TO [facility]    ECG 12 lead (clinic-performed)    Ambulatory referral to Sleep Medicine    losartan (Cozaar) 100 mg tablet    Transthoracic echo (TTE)    carvedilol (Coreg) 3.125 mg tablet    FOLLOW UP TO [facility]    FOLLOW UP TO [facility]   BP elevated.  Increase losartan to 100 mg daily Advise Dash, low-sodium diet, exercise as tolerated    5. Fatigue, unspecified type  R53.83 Ambulatory referral to Sleep Medicine    losartan (Cozaar) 100 mg tablet    Transthoracic echo (TTE)    carvedilol (Coreg) 3.125 mg tablet    FOLLOW UP TO [facility]    FOLLOW UP TO [facility]   Complaints of fatigue despite percutaneous  revascularization Reports history of OSA currently untreated likely the etiology    6. OSA (obstructive sleep apnea)  G47.33 Ambulatory referral to Sleep Medicine    losartan (Cozaar) 100 mg tablet    Transthoracic echo (TTE)    carvedilol (Coreg) 3.125 mg tablet    FOLLOW UP TO [facility]    FOLLOW UP TO [facility]   Reports history of OSA currently untreated Symptoms of fatigue, hypersomnolence Repeat sleep  study    7. Hypersomnolence  G47.10 Ambulatory referral to Sleep Medicine    losartan (Cozaar) 100 mg tablet    Transthoracic echo (TTE)    carvedilol (Coreg) 3.125 mg tablet    FOLLOW UP TO [facility]    FOLLOW UP TO [facility]    8. Nonrheumatic aortic valve stenosis  I35.0 Transthoracic echo (TTE)    carvedilol (Coreg) 3.125 mg tablet    FOLLOW UP TO [facility]    FOLLOW UP TO [facility]   Moderate to severe calcification, moderate aortic stenosis, AVA 0.96 cm, mean gradient 20 mmHg Denies shortness of breath Repeat echo in 6 months      Final recommendations: Patient status post PCI/DES to the distal RCA 10/14/2024. Continue aspirin  and Plavix  as prescribed Increase losartan to 100 mg daily.  Follow-up with me in 4 to 6 weeks for blood pressure reevaluation Obtain repeat sleep study for history of OSA and reports of fatigue, hypersomnolence Consider referral to pulmonology for long-term sleep apnea management Repeat echocardiogram in 6 months to reevaluate moderate aortic valve stenosis  Follow-up with Nicholas Reilly in 6 months with repeat echocardiogram  Attestation Statements  I have reviewed the diagnostic studies, review of systems, past, family, and social history as well as vital signs.  Appropriate changes made as needed.    Patient was seen today under the supervision of Nicholas Reilly       [1]  Current Outpatient Medications:    acetaminophen  (Tylenol  8 Hour) 650 mg ER tablet, Take 650 mg by mouth every 8 (eight) hours if needed for  mild pain (1-4). Do not crush, chew, or split., Disp: , Rfl:    aspirin  81 mg EC tablet, Take 1 tablet (81 mg) by mouth in the morning., Disp: , Rfl:    atorvastatin  (Lipitor ) 80 mg tablet, Take 80 mg by mouth at bedtime., Disp: , Rfl:    carvedilol (Coreg) 3.125 mg tablet, Take 1 tablet (3.125 mg) by mouth with breakfast and with evening meal., Disp: 60 tablet, Rfl: 11   clopidogrel  (Plavix ) 75 mg tablet, Take 75 mg by mouth in the morning., Disp: , Rfl:    cyanocobalamin (Vitamin B-12) 250 mcg tablet, Take 250 mcg by mouth in the morning., Disp: , Rfl:    FLUoxetine (PROzac) 20 mg capsule, Take 1 capsule (20 mg) by mouth in the morning., Disp: 90 capsule, Rfl: 3   gabapentin (Neurontin) 300 mg capsule, Take 1 capsule (300 mg) by mouth every 8 (eight) hours., Disp: 90 capsule, Rfl: 11   losartan (Cozaar) 100 mg tablet, Take 1 tablet (100 mg) by mouth in the morning., Disp: 90 tablet, Rfl: 2   meloxicam (Mobic) 7.5 mg tablet, TAKE 1 TABLET BY MOUTH IN THE MORNING, Disp: 30 tablet, Rfl: 0   multivitamin tablet, Take 1 tablet by mouth in the morning., Disp: , Rfl:    nitroglycerin  (Nitrostat ) 0.4 mg SL tablet, Place 1 tablet (0.4 mg) under the tongue every 5 (five) minutes if needed for chest pain. May repeat dose every 5 minutes for up to 3 doses total., Disp: 25 tablet, Rfl: 11   pantoprazole  (ProtoNix ) 40 mg EC tablet, Take 1 tablet by mouth once daily (Patient taking differently: Take 40 mg by mouth before breakfast.), Disp: 90 tablet, Rfl: 3   tamsulosin (Flomax) 0.4 mg 24 hr capsule, TAKE 1 CAPSULE BY MOUTH ONCE DAILY FOR 14 DAYS (Patient taking differently: Take 0.4 mg by mouth in the morning.), Disp: , Rfl:  valACYclovir (Valtrex) 1 gram tablet, TAKE TWO TABLETS BY MOUTH EVERY 12 HOURS FOR  2  DOSES  AS  NEEDED  FOR  COLD  SORE (Patient taking differently: if needed.), Disp: 40 tablet, Rfl: 0   cyclobenzap-irritant cntr irr2 10 mg kit, TAKE 1/2 TO 1 (ONE-HALF TO ONE) TABLET BY  MOUTH EVERY 8 HOURS AS NEEDED FOR MUSCLE SPASM, Disp: , Rfl:  [2] Past Medical History: Diagnosis Date   Carotid artery disease    - Dr. Laine   Coronary artery disease    Stents multiple times/ MI 10/23/ Aortic Stemnosis- Nicholas Reilly   Depressive disorder    Gastroesophageal reflux disease without esophagitis    HTN (hypertension)    Hyperlipidemia    -Rx   Peripheral neuropathy    Prediabetes    Prostate cancer screening    PSA- Ended due to age   Psoriatic arthritis (HCC)    - Dr.   [place] cancer    Eczema- Ozell Clack   Sleep apnea    NO CPAP  [3] Allergies Allergen Reactions   Hydrocodone Hives   Oxycodone -Acetaminophen  Hives   Amoxicillin-Pot Clavulanate Unknown   Apremilast Other    GI Upset   Diazepam Unknown   Doxycycline Nausea And Vomiting   Isosorbide Mononitrate Headache   Lisinopril Cough   Metoprolol Other    Flushing, GERD, and Palputations.  [4] Past Surgical History: Procedure Laterality Date   COLONOSCOPY     CORONARY STENT PLACEMENT  2023   x2, then replaced in [facility] 2/17 and again 11/23   ESOPHAGEAL DILATION     TOTAL KNEE ARTHROPLASTY     left 5/25- Dr. Hope   UMBILICAL HERNIA REPAIR     5/24- Dr. Precious  [5] Family History Problem Relation Name Age of Onset   No Known Problems Mother     Colon cancer Father    [6] Social History Tobacco Use   Smoking status: Former    Types: Cigarettes    Passive exposure: Past   Smokeless tobacco: Never  Vaping Use   Vaping status: Never Used  Substance Use Topics   Alcohol use: Yes    Comment: occasionally   Drug use: Never
